# Patient Record
Sex: Male | Born: 1970 | Race: Asian | Hispanic: No | Marital: Single | Smoking: Current every day smoker
Health system: Southern US, Community
[De-identification: ages and names within clinical notes are randomized; demographics above are authoritative.]

## PROBLEM LIST (undated history)

## (undated) DIAGNOSIS — Z9109 Other allergy status, other than to drugs and biological substances: Secondary | ICD-10-CM

## (undated) DIAGNOSIS — J302 Other seasonal allergic rhinitis: Secondary | ICD-10-CM

## (undated) DIAGNOSIS — Z8719 Personal history of other diseases of the digestive system: Secondary | ICD-10-CM

## (undated) DIAGNOSIS — Z8711 Personal history of peptic ulcer disease: Secondary | ICD-10-CM

## (undated) HISTORY — DX: Other seasonal allergic rhinitis: J30.2

## (undated) HISTORY — DX: Personal history of peptic ulcer disease: Z87.11

## (undated) HISTORY — DX: Personal history of other diseases of the digestive system: Z87.19

## (undated) HISTORY — DX: Other allergy status, other than to drugs and biological substances: Z91.09

## (undated) HISTORY — PX: NO PAST SURGERIES: SHX2092

---

## 2012-07-05 ENCOUNTER — Emergency Department: Payer: Self-pay | Admitting: Emergency Medicine

## 2012-07-08 ENCOUNTER — Emergency Department: Payer: Self-pay | Admitting: Internal Medicine

## 2012-07-12 ENCOUNTER — Emergency Department: Payer: Self-pay | Admitting: Unknown Physician Specialty

## 2012-07-19 ENCOUNTER — Emergency Department: Payer: Self-pay | Admitting: Emergency Medicine

## 2015-01-01 ENCOUNTER — Encounter: Payer: Self-pay | Admitting: Physician Assistant

## 2015-01-01 ENCOUNTER — Ambulatory Visit (INDEPENDENT_AMBULATORY_CARE_PROVIDER_SITE_OTHER): Payer: BLUE CROSS/BLUE SHIELD | Admitting: Physician Assistant

## 2015-01-01 VITALS — BP 128/84 | HR 81 | Temp 97.9°F | Resp 16 | Ht 64.0 in | Wt 161.2 lb

## 2015-01-01 DIAGNOSIS — Z720 Tobacco use: Secondary | ICD-10-CM

## 2015-01-01 DIAGNOSIS — Z0000 Encounter for general adult medical examination without abnormal findings: Secondary | ICD-10-CM | POA: Diagnosis not present

## 2015-01-01 DIAGNOSIS — Z91048 Other nonmedicinal substance allergy status: Secondary | ICD-10-CM | POA: Diagnosis not present

## 2015-01-01 DIAGNOSIS — Z9109 Other allergy status, other than to drugs and biological substances: Secondary | ICD-10-CM | POA: Insufficient documentation

## 2015-01-01 DIAGNOSIS — F17200 Nicotine dependence, unspecified, uncomplicated: Secondary | ICD-10-CM | POA: Insufficient documentation

## 2015-01-01 NOTE — Patient Instructions (Signed)
Please stop by the front desk to schedule a lab appointment. Please come fasting to that appointment. I will call you with your results.  Preventive Care for Adults A healthy lifestyle and preventive care can promote health and wellness. Preventive health guidelines for men include the following key practices:  A routine yearly physical is a good way to check with your health care provider about your health and preventative screening. It is a chance to share any concerns and updates on your health and to receive a thorough exam.  Visit your dentist for a routine exam and preventative care every 6 months. Brush your teeth twice a day and floss once a day. Good oral hygiene prevents tooth decay and gum disease.  The frequency of eye exams is based on your age, health, family medical history, use of contact lenses, and other factors. Follow your health care provider's recommendations for frequency of eye exams.  Eat a healthy diet. Foods such as vegetables, fruits, whole grains, low-fat dairy products, and lean protein foods contain the nutrients you need without too many calories. Decrease your intake of foods high in solid fats, added sugars, and salt. Eat the right amount of calories for you.Get information about a proper diet from your health care provider, if necessary.  Regular physical exercise is one of the most important things you can do for your health. Most adults should get at least 150 minutes of moderate-intensity exercise (any activity that increases your heart rate and causes you to sweat) each week. In addition, most adults need muscle-strengthening exercises on 2 or more days a week.  Maintain a healthy weight. The body mass index (BMI) is a screening tool to identify possible weight problems. It provides an estimate of body fat based on height and weight. Your health care provider can find your BMI and can help you achieve or maintain a healthy weight.For adults 20 years and  older:  A BMI below 18.5 is considered underweight.  A BMI of 18.5 to 24.9 is normal.  A BMI of 25 to 29.9 is considered overweight.  A BMI of 30 and above is considered obese.  Maintain normal blood lipids and cholesterol levels by exercising and minimizing your intake of saturated fat. Eat a balanced diet with plenty of fruit and vegetables. Blood tests for lipids and cholesterol should begin at age 65 and be repeated every 5 years. If your lipid or cholesterol levels are high, you are over 50, or you are at high risk for heart disease, you may need your cholesterol levels checked more frequently.Ongoing high lipid and cholesterol levels should be treated with medicines if diet and exercise are not working.  If you smoke, find out from your health care provider how to quit. If you do not use tobacco, do not start.  Lung cancer screening is recommended for adults aged 57-80 years who are at high risk for developing lung cancer because of a history of smoking. A yearly low-dose CT scan of the lungs is recommended for people who have at least a 30-pack-year history of smoking and are a current smoker or have quit within the past 15 years. A pack year of smoking is smoking an average of 1 pack of cigarettes a day for 1 year (for example: 1 pack a day for 30 years or 2 packs a day for 15 years). Yearly screening should continue until the smoker has stopped smoking for at least 15 years. Yearly screening should be stopped for people who  develop a health problem that would prevent them from having lung cancer treatment.  If you choose to drink alcohol, do not have more than 2 drinks per day. One drink is considered to be 12 ounces (355 mL) of beer, 5 ounces (148 mL) of wine, or 1.5 ounces (44 mL) of liquor.  Avoid use of street drugs. Do not share needles with anyone. Ask for help if you need support or instructions about stopping the use of drugs.  High blood pressure causes heart disease and  increases the risk of stroke. Your blood pressure should be checked at least every 1-2 years. Ongoing high blood pressure should be treated with medicines, if weight loss and exercise are not effective.  If you are 36-51 years old, ask your health care provider if you should take aspirin to prevent heart disease.  Diabetes screening involves taking a blood sample to check your fasting blood sugar level. This should be done once every 3 years, after age 71, if you are within normal weight and without risk factors for diabetes. Testing should be considered at a younger age or be carried out more frequently if you are overweight and have at least 1 risk factor for diabetes.  Colorectal cancer can be detected and often prevented. Most routine colorectal cancer screening begins at the age of 14 and continues through age 15. However, your health care provider may recommend screening at an earlier age if you have risk factors for colon cancer. On a yearly basis, your health care provider may provide home test kits to check for hidden blood in the stool. Use of a small camera at the end of a tube to directly examine the colon (sigmoidoscopy or colonoscopy) can detect the earliest forms of colorectal cancer. Talk to your health care provider about this at age 46, when routine screening begins. Direct exam of the colon should be repeated every 5-10 years through age 34, unless early forms of precancerous polyps or small growths are found.  People who are at an increased risk for hepatitis B should be screened for this virus. You are considered at high risk for hepatitis B if:  You were born in a country where hepatitis B occurs often. Talk with your health care provider about which countries are considered high risk.  Your parents were born in a high-risk country and you have not received a shot to protect against hepatitis B (hepatitis B vaccine).  You have HIV or AIDS.  You use needles to inject street  drugs.  You live with, or have sex with, someone who has hepatitis B.  You are a man who has sex with other men (MSM).  You get hemodialysis treatment.  You take certain medicines for conditions such as cancer, organ transplantation, and autoimmune conditions.  Hepatitis C blood testing is recommended for all people born from [birth year] through 1965 and any individual with known risks for hepatitis C.  Practice safe sex. Use condoms and avoid high-risk sexual practices to reduce the spread of sexually transmitted infections (STIs). STIs include gonorrhea, chlamydia, syphilis, trichomonas, herpes, HPV, and human immunodeficiency virus (HIV). Herpes, HIV, and HPV are viral illnesses that have no cure. They can result in disability, cancer, and death.  If you are at risk of being infected with HIV, it is recommended that you take a prescription medicine daily to prevent HIV infection. This is called preexposure prophylaxis (PrEP). You are considered at risk if:  You are a man who has sex with  other men (MSM) and have other risk factors.  You are a heterosexual man, are sexually active, and are at increased risk for HIV infection.  You take drugs by injection.  You are sexually active with a partner who has HIV.  Talk with your health care provider about whether you are at high risk of being infected with HIV. If you choose to begin PrEP, you should first be tested for HIV. You should then be tested every 3 months for as long as you are taking PrEP.  A one-time screening for abdominal aortic aneurysm (AAA) and surgical repair of large AAAs by ultrasound are recommended for men ages 10 to 10 years who are current or former smokers.  Healthy men should no longer receive prostate-specific antigen (PSA) blood tests as part of routine cancer screening. Talk with your health care provider about prostate cancer screening.  Testicular cancer screening is not recommended for adult males who have no  symptoms. Screening includes self-exam, a health care provider exam, and other screening tests. Consult with your health care provider about any symptoms you have or any concerns you have about testicular cancer.  Use sunscreen. Apply sunscreen liberally and repeatedly throughout the day. You should seek shade when your shadow is shorter than you. Protect yourself by wearing long sleeves, pants, a wide-brimmed hat, and sunglasses year round, whenever you are outdoors.  Once a month, do a whole-body skin exam, using a mirror to look at the skin on your back. Tell your health care provider about new moles, moles that have irregular borders, moles that are larger than a pencil eraser, or moles that have changed in shape or color.  Stay current with required vaccines (immunizations).  Influenza vaccine. All adults should be immunized every year.  Tetanus, diphtheria, and acellular pertussis (Td, Tdap) vaccine. An adult who has not previously received Tdap or who does not know his vaccine status should receive 1 dose of Tdap. This initial dose should be followed by tetanus and diphtheria toxoids (Td) booster doses every 10 years. Adults with an unknown or incomplete history of completing a 3-dose immunization series with Td-containing vaccines should begin or complete a primary immunization series including a Tdap dose. Adults should receive a Td booster every 10 years.  Varicella vaccine. An adult without evidence of immunity to varicella should receive 2 doses or a second dose if he has previously received 1 dose.  Human papillomavirus (HPV) vaccine. Males aged 40-21 years who have not received the vaccine previously should receive the 3-dose series. Males aged 22-26 years may be immunized. Immunization is recommended through the age of 51 years for any male who has sex with males and did not get any or all doses earlier. Immunization is recommended for any person with an immunocompromised condition  through the age of 35 years if he did not get any or all doses earlier. During the 3-dose series, the second dose should be obtained 4-8 weeks after the first dose. The third dose should be obtained 24 weeks after the first dose and 16 weeks after the second dose.  Zoster vaccine. One dose is recommended for adults aged 47 years or older unless certain conditions are present.  Measles, mumps, and rubella (MMR) vaccine. Adults born before [birth year] generally are considered immune to measles and mumps. Adults born in [birth year] or later should have 1 or more doses of MMR vaccine unless there is a contraindication to the vaccine or there is laboratory evidence of immunity to each  of the three diseases. A routine second dose of MMR vaccine should be obtained at least 28 days after the first dose for students attending postsecondary schools, health care workers, or international travelers. People who received inactivated measles vaccine or an unknown type of measles vaccine during 1963-1967 should receive 2 doses of MMR vaccine. People who received inactivated mumps vaccine or an unknown type of mumps vaccine before 1979 and are at high risk for mumps infection should consider immunization with 2 doses of MMR vaccine. Unvaccinated health care workers born before [birth year] who lack laboratory evidence of measles, mumps, or rubella immunity or laboratory confirmation of disease should consider measles and mumps immunization with 2 doses of MMR vaccine or rubella immunization with 1 dose of MMR vaccine.  Pneumococcal 13-valent conjugate (PCV13) vaccine. When indicated, a person who is uncertain of his immunization history and has no record of immunization should receive the PCV13 vaccine. An adult aged 34 years or older who has certain medical conditions and has not been previously immunized should receive 1 dose of PCV13 vaccine. This PCV13 should be followed with a dose of pneumococcal polysaccharide (PPSV23) vaccine. The PPSV23  vaccine dose should be obtained at least 8 weeks after the dose of PCV13 vaccine. An adult aged 73 years or older who has certain medical conditions and previously received 1 or more doses of PPSV23 vaccine should receive 1 dose of PCV13. The PCV13 vaccine dose should be obtained 1 or more years after the last PPSV23 vaccine dose.  Pneumococcal polysaccharide (PPSV23) vaccine. When PCV13 is also indicated, PCV13 should be obtained first. All adults aged 42 years and older should be immunized. An adult younger than age 64 years who has certain medical conditions should be immunized. Any person who resides in a nursing home or long-term care facility should be immunized. An adult smoker should be immunized. People with an immunocompromised condition and certain other conditions should receive both PCV13 and PPSV23 vaccines. People with human immunodeficiency virus (HIV) infection should be immunized as soon as possible after diagnosis. Immunization during chemotherapy or radiation therapy should be avoided. Routine use of PPSV23 vaccine is not recommended for American Indians, [place] Natives, or people younger than 65 years unless there are medical conditions that require PPSV23 vaccine. When indicated, people who have unknown immunization and have no record of immunization should receive PPSV23 vaccine. One-time revaccination 5 years after the first dose of PPSV23 is recommended for people aged 19-64 years who have chronic kidney failure, nephrotic syndrome, asplenia, or immunocompromised conditions. People who received 1-2 doses of PPSV23 before age 44 years should receive another dose of PPSV23 vaccine at age 52 years or later if at least 5 years have passed since the previous dose. Doses of PPSV23 are not needed for people immunized with PPSV23 at or after age 47 years.  Meningococcal vaccine. Adults with asplenia or persistent complement component deficiencies should receive 2 doses of quadrivalent  meningococcal conjugate (MenACWY-D) vaccine. The doses should be obtained at least 2 months apart. Microbiologists working with certain meningococcal bacteria, [place] recruits, people at risk during an outbreak, and people who travel to or live in countries with a high rate of meningitis should be immunized. A first-year college student up through age 14 years who is living in a residence hall should receive a dose if he did not receive a dose on or after his 16th birthday. Adults who have certain high-risk conditions should receive one or more doses of vaccine.  Hepatitis A  vaccine. Adults who wish to be protected from this disease, have certain high-risk conditions, work with hepatitis A-infected animals, work in hepatitis A research labs, or travel to or work in countries with a high rate of hepatitis A should be immunized. Adults who were previously unvaccinated and who anticipate close contact with an international adoptee during the first 60 days after arrival in the [place] from a country with a high rate of hepatitis A should be immunized.  Hepatitis B vaccine. Adults should be immunized if they wish to be protected from this disease, have certain high-risk conditions, may be exposed to blood or other infectious body fluids, are household contacts or sex partners of hepatitis B positive people, are clients or workers in certain care facilities, or travel to or work in countries with a high rate of hepatitis B.  Haemophilus influenzae type b (Hib) vaccine. A previously unvaccinated person with asplenia or sickle cell disease or having a scheduled splenectomy should receive 1 dose of Hib vaccine. Regardless of previous immunization, a recipient of a hematopoietic stem cell transplant should receive a 3-dose series 6-12 months after his successful transplant. Hib vaccine is not recommended for adults with HIV infection. Preventive Service / Frequency Ages 72 to 71  Blood pressure check.** /  Every 1 to 2 years.  Lipid and cholesterol check.** / Every 5 years beginning at age 34.  Hepatitis C blood test.** / For any individual with known risks for hepatitis C.  Skin self-exam. / Monthly.  Influenza vaccine. / Every year.  Tetanus, diphtheria, and acellular pertussis (Tdap, Td) vaccine.** / Consult your health care provider. 1 dose of Td every 10 years.  Varicella vaccine.** / Consult your health care provider.  HPV vaccine. / 3 doses over 6 months, if 39 or younger.  Measles, mumps, rubella (MMR) vaccine.** / You need at least 1 dose of MMR if you were born in 1957 or later. You may also need a second dose.  Pneumococcal 13-valent conjugate (PCV13) vaccine.** / Consult your health care provider.  Pneumococcal polysaccharide (PPSV23) vaccine.** / 1 to 2 doses if you smoke cigarettes or if you have certain conditions.  Meningococcal vaccine.** / 1 dose if you are age 81 to 22 years and a Market researcher living in a residence hall, or have one of several medical conditions. You may also need additional booster doses.  Hepatitis A vaccine.** / Consult your health care provider.  Hepatitis B vaccine.** / Consult your health care provider.  Haemophilus influenzae type b (Hib) vaccine.** / Consult your health care provider. Ages 100 to 76  Blood pressure check.** / Every 1 to 2 years.  Lipid and cholesterol check.** / Every 5 years beginning at age 64.  Lung cancer screening. / Every year if you are aged 57-80 years and have a 30-pack-year history of smoking and currently smoke or have quit within the past 15 years. Yearly screening is stopped once you have quit smoking for at least 15 years or develop a health problem that would prevent you from having lung cancer treatment.  Fecal occult blood test (FOBT) of stool. / Every year beginning at age 20 and continuing until age 5. You may not have to do this test if you get a colonoscopy every 10 years.  Flexible  sigmoidoscopy** or colonoscopy.** / Every 5 years for a flexible sigmoidoscopy or every 10 years for a colonoscopy beginning at age 16 and continuing until age 66.  Hepatitis C blood test.** / For  all people born from [birth year] through 1965 and any individual with known risks for hepatitis C.  Skin self-exam. / Monthly.  Influenza vaccine. / Every year.  Tetanus, diphtheria, and acellular pertussis (Tdap/Td) vaccine.** / Consult your health care provider. 1 dose of Td every 10 years.  Varicella vaccine.** / Consult your health care provider.  Zoster vaccine.** / 1 dose for adults aged 39 years or older.  Measles, mumps, rubella (MMR) vaccine.** / You need at least 1 dose of MMR if you were born in 1957 or later. You may also need a second dose.  Pneumococcal 13-valent conjugate (PCV13) vaccine.** / Consult your health care provider.  Pneumococcal polysaccharide (PPSV23) vaccine.** / 1 to 2 doses if you smoke cigarettes or if you have certain conditions.  Meningococcal vaccine.** / Consult your health care provider.  Hepatitis A vaccine.** / Consult your health care provider.  Hepatitis B vaccine.** / Consult your health care provider.  Haemophilus influenzae type b (Hib) vaccine.** / Consult your health care provider. Ages 82 and over  Blood pressure check.** / Every 1 to 2 years.  Lipid and cholesterol check.**/ Every 5 years beginning at age 49.  Lung cancer screening. / Every year if you are aged 2-80 years and have a 30-pack-year history of smoking and currently smoke or have quit within the past 15 years. Yearly screening is stopped once you have quit smoking for at least 15 years or develop a health problem that would prevent you from having lung cancer treatment.  Fecal occult blood test (FOBT) of stool. / Every year beginning at age 66 and continuing until age 73. You may not have to do this test if you get a colonoscopy every 10 years.  Flexible sigmoidoscopy** or  colonoscopy.** / Every 5 years for a flexible sigmoidoscopy or every 10 years for a colonoscopy beginning at age 75 and continuing until age 24.  Hepatitis C blood test.** / For all people born from [birth year] through 1965 and any individual with known risks for hepatitis C.  Abdominal aortic aneurysm (AAA) screening.** / A one-time screening for ages 34 to 52 years who are current or former smokers.  Skin self-exam. / Monthly.  Influenza vaccine. / Every year.  Tetanus, diphtheria, and acellular pertussis (Tdap/Td) vaccine.** / 1 dose of Td every 10 years.  Varicella vaccine.** / Consult your health care provider.  Zoster vaccine.** / 1 dose for adults aged 51 years or older.  Pneumococcal 13-valent conjugate (PCV13) vaccine.** / Consult your health care provider.  Pneumococcal polysaccharide (PPSV23) vaccine.** / 1 dose for all adults aged 26 years and older.  Meningococcal vaccine.** / Consult your health care provider.  Hepatitis A vaccine.** / Consult your health care provider.  Hepatitis B vaccine.** / Consult your health care provider.  Haemophilus influenzae type b (Hib) vaccine.** / Consult your health care provider. **Family history and personal history of risk and conditions may change your health care provider's recommendations. Document Released: 11/11/2001 Document Revised: 09/20/2013 Document Reviewed: 02/10/2011 [facility] Patient Information 2015 [place], [place]. This information is not intended to replace advice given to you by your health care provider. Make sure you discuss any questions you have with your health care provider.

## 2015-01-01 NOTE — Assessment & Plan Note (Signed)
-  Stable. Continue current medication regimen. 

## 2015-01-01 NOTE — Progress Notes (Signed)
Pre visit review using our clinic review tool, if applicable. No additional management support is needed unless otherwise documented below in the visit note/SLS  

## 2015-01-01 NOTE — Progress Notes (Signed)
Patient presents to clinic today to establish care. Is requesting a CPE. Is not fasting for labs.  Acute Concerns: Patient denies acute concerns at today's visit.  Chronic Issues: Environmental allergies  -- takes allergy shots weekly. Uses Allegra when necessary. Endorses good control of symptoms. Denies breakthrough symptoms as long as he is compliant with medications.   Health Maintenance: Dental -- up-to-date  Vision -- up-to-date  Immunizations -- patient endorses up-to-date   Past Medical History  Diagnosis Date  . Seasonal allergies   . Environmental allergies     Past Surgical History  Procedure Laterality Date  . No past surgeries      No current outpatient prescriptions on file prior to visit.   No current facility-administered medications on file prior to visit.    No Known Allergies  Family History  Problem Relation Age of Onset  . Other Father 43    MVA - Deceased  . Healthy Mother   . COPD Maternal Aunt     Deceased  . Healthy Brother     x1  . Healthy Sister     x1  . Healthy Son     x1  . Healthy Daughter     x4    History   Social History  . Marital Status: Single    Spouse Name: N/A  . Number of Children: N/A  . Years of Education: N/A   Occupational History  . Not on file.   Social History Main Topics  . Smoking status: Current Every Day Smoker    Types: Cigarettes  . Smokeless tobacco: Not on file  . Alcohol Use: Not on file  . Drug Use: Not on file  . Sexual Activity: Not on file   Other Topics Concern  . Not on file   Social History Narrative  . No narrative on file    Review of Systems  Constitutional: Negative for fever and weight loss.  HENT: Negative for ear discharge, ear pain, hearing loss and tinnitus.   Eyes: Negative for blurred vision, double vision, photophobia and pain.  Respiratory: Negative for cough and shortness of breath.   Cardiovascular: Negative for chest pain and palpitations.    Gastrointestinal: Negative for heartburn, nausea, vomiting, abdominal pain, diarrhea, constipation, blood in stool and melena.  Genitourinary: Negative for dysuria, urgency, frequency, hematuria and flank pain.  Musculoskeletal: Negative for falls.  Neurological: Negative for dizziness, loss of consciousness and headaches.  Endo/Heme/Allergies: Negative for environmental allergies.  Psychiatric/Behavioral: Negative for depression, suicidal ideas, hallucinations and substance abuse. The patient is not nervous/anxious and does not have insomnia.    BP 128/84 mmHg  Pulse 81  Temp(Src) 97.9 F (36.6 C) (Oral)  Resp 16  Ht 5\' 4"  (1.626 m)  Wt 161 lb 4 oz (73.143 kg)  BMI 27.67 kg/m2  SpO2 99%  Physical Exam  Constitutional: He is oriented to person, place, and time and well-developed, well-nourished, and in no distress.  HENT:  Head: Normocephalic and atraumatic.  Right Ear: External ear normal.  Left Ear: External ear normal.  Nose: Nose normal.  Mouth/Throat: Oropharynx is clear and moist. No oropharyngeal exudate.  Eyes: Conjunctivae and EOM are normal. Pupils are equal, round, and reactive to light.  Neck: Neck supple. No thyromegaly present.  Cardiovascular: Normal rate, regular rhythm, normal heart sounds and intact distal pulses.   Pulmonary/Chest: Effort normal and breath sounds normal. No respiratory distress. He has no wheezes. He has no rales. He exhibits no tenderness.  Abdominal: Soft. Bowel sounds are normal. He exhibits no distension and no mass. There is no tenderness. There is no rebound and no guarding.  Genitourinary: Testes/scrotum normal.  Patient defers DRE.  Lymphadenopathy:    He has no cervical adenopathy.  Neurological: He is alert and oriented to person, place, and time.  Skin: Skin is warm and dry. No rash noted.  Psychiatric: Affect normal.  Vitals reviewed.  Assessment/Plan: Visit for preventive health examination I have reviewed the patient's  medical history in detail and updated the computerized patient record. PHQ -2 screen negative. He had tetanus in 2008.Health Maintenance up-to-date per patient. Preventative care discussed with patient. Handout given. Patient is not fasting for labs. Lab orders placed for future draw. Lab appointment scheduled..     Environmental allergies Stable. Continue current medication regimen.   Tobacco use disorder Smokes 2 packs per week. Only smoking or drinking. Is ready to quit, but wants to try on his own. Smoking cessation discussed. Handout given.

## 2015-01-01 NOTE — Assessment & Plan Note (Signed)
Smokes 2 packs per week. Only smoking or drinking. Is ready to quit, but wants to try on his own. Smoking cessation discussed. Handout given.

## 2015-01-01 NOTE — Assessment & Plan Note (Signed)
I have reviewed the patient's medical history in detail and updated the computerized patient record. PHQ -2 screen negative. He had tetanus in 2008.Health Maintenance up-to-date per patient. Preventative care discussed with patient. Handout given. Patient is not fasting for labs. Lab orders placed for future draw. Lab appointment scheduled.Marland Kitchen

## 2015-01-02 ENCOUNTER — Telehealth: Payer: Self-pay | Admitting: Physician Assistant

## 2015-01-02 NOTE — Telephone Encounter (Signed)
emmi emailed °

## 2015-01-03 ENCOUNTER — Other Ambulatory Visit (INDEPENDENT_AMBULATORY_CARE_PROVIDER_SITE_OTHER): Payer: BLUE CROSS/BLUE SHIELD

## 2015-01-03 DIAGNOSIS — Z0000 Encounter for general adult medical examination without abnormal findings: Secondary | ICD-10-CM | POA: Diagnosis not present

## 2015-01-03 LAB — URINALYSIS, ROUTINE W REFLEX MICROSCOPIC
Bilirubin Urine: NEGATIVE
Hgb urine dipstick: NEGATIVE
Ketones, ur: NEGATIVE
Leukocytes, UA: NEGATIVE
Nitrite: NEGATIVE
RBC / HPF: NONE SEEN (ref 0–?)
Specific Gravity, Urine: >=1.03 — AB (ref 1.000–1.030)
TOTAL PROTEIN, URINE-UPE24: NEGATIVE
URINE GLUCOSE: NEGATIVE
Urobilinogen, UA: 0.2 (ref 0.0–1.0)
WBC UA: NONE SEEN (ref 0–?)
pH: 5.5 (ref 5.0–8.0)

## 2015-01-03 LAB — CBC
HCT: 44.5 % (ref 39.0–52.0)
Hemoglobin: 15.3 g/dL (ref 13.0–17.0)
MCHC: 34.4 g/dL (ref 30.0–36.0)
MCV: 92.5 fl (ref 78.0–100.0)
Platelets: 173 10*3/uL (ref 150.0–400.0)
RBC: 4.81 Mil/uL (ref 4.22–5.81)
RDW: 13.4 % (ref 11.5–15.5)
WBC: 5 10*3/uL (ref 4.0–10.5)

## 2015-01-03 LAB — TSH: TSH: 1.18 u[IU]/mL (ref 0.35–4.50)

## 2015-01-03 LAB — LDL CHOLESTEROL, DIRECT: Direct LDL: 135 mg/dL

## 2015-01-03 LAB — COMPREHENSIVE METABOLIC PANEL
ALT: 30 U/L (ref 0–53)
AST: 19 U/L (ref 0–37)
Albumin: 4.1 g/dL (ref 3.5–5.2)
Alkaline Phosphatase: 45 U/L (ref 39–117)
BILIRUBIN TOTAL: 0.5 mg/dL (ref 0.2–1.2)
BUN: 15 mg/dL (ref 6–23)
CALCIUM: 9.5 mg/dL (ref 8.4–10.5)
CO2: 25 mEq/L (ref 19–32)
Chloride: 105 mEq/L (ref 96–112)
Creatinine, Ser: 0.84 mg/dL (ref 0.40–1.50)
GFR: 105.69 mL/min (ref >60.00)
Glucose, Bld: 99 mg/dL (ref 70–99)
Potassium: 3.9 mEq/L (ref 3.5–5.1)
Sodium: 135 mEq/L (ref 135–145)
Total Protein: 7 g/dL (ref 6.0–8.3)

## 2015-01-03 LAB — LIPID PANEL
CHOLESTEROL: 260 mg/dL — AB (ref 0–200)
HDL: 40 mg/dL (ref >39.00)
Total CHOL/HDL Ratio: 7

## 2015-01-03 LAB — HEMOGLOBIN A1C: HEMOGLOBIN A1C: 5.5 % (ref 4.6–6.5)

## 2015-01-05 ENCOUNTER — Telehealth: Payer: Self-pay | Admitting: *Deleted

## 2015-01-05 MED ORDER — FENOFIBRATE 54 MG PO TABS: 54.0000 mg | ORAL_TABLET | Freq: Every day | ORAL | Status: DC

## 2015-01-05 NOTE — Telephone Encounter (Signed)
-----   Message from Wilfrid Lund, [place] sent at 01/05/2015  7:38 AM EDT -----   ----- Message -----    From: Brunetta Jeans, PA-C    Sent: 01/04/2015  11:04 AM      To: Wilfrid Lund, CMA  Labs good overall.  LDL cholesterol slightly elevated but not worrisome.  His TGL levels are moderately elevated in the 400s.  This increases risk of injury to the pancrea which can result in infection and diabetes.  He needs to avoid all consumption of alcohol and limit refined sugars.  I would like him to start a medication -- fenofibrate -- daily to help lower this levels.  Will hopefully only need this medication for a short period of time.  If he is willing, ok to send in Rx for fenofibrate 54 mg -- take 1 tablet by mouth daily. Quantity 30. 1 refill. Diagnosis -- elevated triglycerides.  Want him to follow-up in 2 months.

## 2015-01-05 NOTE — Telephone Encounter (Signed)
Patient informed, understood & agreed; new Rx to pharmacy/[facility]  

## 2015-03-20 ENCOUNTER — Telehealth: Payer: Self-pay | Admitting: Physician Assistant

## 2015-03-20 NOTE — Telephone Encounter (Signed)
Patient came to clinic today requesting shots for bug bites because going [place] seas to [place] on July 7th; would like to discuss getting appt for that. [phone]

## 2015-03-20 NOTE — Telephone Encounter (Signed)
Reviewed his records -- no immunizations on file.  Recommend he schedule an appointment in office with me to discuss trip, length of stay and areas he will be traveling as this will affect vaccinations needed. Hep A and Hep B as well as typhoid vaccinations are recommended.  Will also likely need antimalarial medication started.  Again, he needs office visit.

## 2016-01-07 ENCOUNTER — Ambulatory Visit (INDEPENDENT_AMBULATORY_CARE_PROVIDER_SITE_OTHER): Payer: BLUE CROSS/BLUE SHIELD | Admitting: Physician Assistant

## 2016-01-07 ENCOUNTER — Encounter: Payer: Self-pay | Admitting: Physician Assistant

## 2016-01-07 VITALS — BP 106/68 | HR 87 | Temp 97.7°F | Ht 64.0 in | Wt 165.8 lb

## 2016-01-07 DIAGNOSIS — K219 Gastro-esophageal reflux disease without esophagitis: Secondary | ICD-10-CM | POA: Diagnosis not present

## 2016-01-07 DIAGNOSIS — Z0000 Encounter for general adult medical examination without abnormal findings: Secondary | ICD-10-CM

## 2016-01-07 DIAGNOSIS — Z6828 Body mass index (BMI) 28.0-28.9, adult: Secondary | ICD-10-CM

## 2016-01-07 DIAGNOSIS — F17200 Nicotine dependence, unspecified, uncomplicated: Secondary | ICD-10-CM | POA: Diagnosis not present

## 2016-01-07 DIAGNOSIS — R7989 Other specified abnormal findings of blood chemistry: Secondary | ICD-10-CM | POA: Diagnosis not present

## 2016-01-07 LAB — URINALYSIS, ROUTINE W REFLEX MICROSCOPIC
Bilirubin Urine: NEGATIVE
Hgb urine dipstick: NEGATIVE
KETONES UR: NEGATIVE
Leukocytes, UA: NEGATIVE
Nitrite: NEGATIVE
RBC / HPF: NONE SEEN (ref 0–?)
SPECIFIC GRAVITY, URINE: 1.025 (ref 1.000–1.030)
Total Protein, Urine: NEGATIVE
Urine Glucose: NEGATIVE
Urobilinogen, UA: 0.2 (ref 0.0–1.0)
WBC UA: NONE SEEN (ref 0–?)
pH: 5.5 (ref 5.0–8.0)

## 2016-01-07 LAB — COMPREHENSIVE METABOLIC PANEL
ALT: 41 U/L (ref 0–53)
AST: 22 U/L (ref 0–37)
Albumin: 4.5 g/dL (ref 3.5–5.2)
Alkaline Phosphatase: 48 U/L (ref 39–117)
BILIRUBIN TOTAL: 0.5 mg/dL (ref 0.2–1.2)
BUN: 14 mg/dL (ref 6–23)
CO2: 28 meq/L (ref 19–32)
CREATININE: 0.87 mg/dL (ref 0.40–1.50)
Calcium: 10.6 mg/dL — ABNORMAL HIGH (ref 8.4–10.5)
Chloride: 106 mEq/L (ref 96–112)
GFR: 101.03 mL/min (ref >60.00)
GLUCOSE: 105 mg/dL — AB (ref 70–99)
Potassium: 4.4 mEq/L (ref 3.5–5.1)
SODIUM: 139 meq/L (ref 135–145)
Total Protein: 7.5 g/dL (ref 6.0–8.3)

## 2016-01-07 LAB — LDL CHOLESTEROL, DIRECT: Direct LDL: 117 mg/dL

## 2016-01-07 LAB — CBC
HCT: 44.6 % (ref 39.0–52.0)
Hemoglobin: 15.4 g/dL (ref 13.0–17.0)
MCHC: 34.5 g/dL (ref 30.0–36.0)
MCV: 92.7 fl (ref 78.0–100.0)
Platelets: 193 10*3/uL (ref 150.0–400.0)
RBC: 4.81 Mil/uL (ref 4.22–5.81)
RDW: 12.9 % (ref 11.5–15.5)
WBC: 5.2 10*3/uL (ref 4.0–10.5)

## 2016-01-07 LAB — LIPID PANEL
CHOLESTEROL: 252 mg/dL — AB (ref 0–200)
HDL: 37.1 mg/dL — ABNORMAL LOW (ref >39.00)
Total CHOL/HDL Ratio: 7

## 2016-01-07 LAB — TSH: TSH: 1.03 u[IU]/mL (ref 0.35–4.50)

## 2016-01-07 LAB — H. PYLORI ANTIBODY, IGG: H PYLORI IGG: NEGATIVE

## 2016-01-07 LAB — HEMOGLOBIN A1C: Hgb A1c MFr Bld: 5.7 % (ref 4.6–6.5)

## 2016-01-07 MED ORDER — PANTOPRAZOLE SODIUM 40 MG PO TBEC: 40.0000 mg | DELAYED_RELEASE_TABLET | Freq: Every day | ORAL | Status: DC

## 2016-01-07 NOTE — Assessment & Plan Note (Signed)
Will begin Protonix and GERD diet. Will check H. Pylori IgG today.

## 2016-01-07 NOTE — Progress Notes (Signed)
Patient presents to clinic today for annual exam.  Patient is fasting for labs.  Acute Concerns: Patient endorses history of peptic ulcer 20 years ago. Endorses having reflux and heart burn with some epigastric pain noted sometimes with heavy meals and with alcohol consumption. Endorses drinking a couple of drinks 1-2 x week. Is also currently a smoker, smoking 0-10 cigarettes per day. Denies nausea, vomiting or bowel changes. Will sometimes take OTC antacids with some relief of symptoms.   Chronic Issues: Environmental Allergies -- Endorses well controlled with Allegra and Flonase.   Tobacco Use Disorder -- Endorses smoking anywhere from 0-10 cigarettes per day, smoking less during the week and more on weekends.   Health Maintenance: Immunizations -- Flu shot and Tetanus shots are up-to-date.  Past Medical History  Diagnosis Date  . Seasonal allergies   . Environmental allergies   . History of stomach ulcers     Past Surgical History  Procedure Laterality Date  . No past surgeries      Current Outpatient Prescriptions on File Prior to Visit  Medication Sig Dispense Refill  . cetirizine (ZYRTEC) 10 MG tablet Take 10 mg by mouth daily as needed for allergies.    . fexofenadine (ALLEGRA) 180 MG tablet Take 180 mg by mouth daily as needed for allergies or rhinitis. Reported on 01/07/2016     No current facility-administered medications on file prior to visit.    No Known Allergies  Family History  Problem Relation Age of Onset  . Other Father 43    MVA - Deceased  . Healthy Mother   . COPD Maternal Aunt     Deceased  . Healthy Brother     x1  . Healthy Sister     x1  . Healthy Son     x1  . Healthy Daughter     x4    Social History   Social History  . Marital Status: Single    Spouse Name: N/A  . Number of Children: N/A  . Years of Education: N/A   Occupational History  . Not on file.   Social History Main Topics  . Smoking status: Current Every Day  Smoker    Types: Cigarettes  . Smokeless tobacco: Never Used     Comment: 2 packs per week  . Alcohol Use: 0.0 oz/week    0 Standard drinks or equivalent per week  . Drug Use: No  . Sexual Activity:    Partners: Female   Other Topics Concern  . Not on file   Social History Narrative   Review of Systems  Constitutional: Negative for fever and weight loss.  HENT: Negative for ear discharge, ear pain, hearing loss and tinnitus.   Eyes: Negative for blurred vision, double vision, photophobia and pain.  Respiratory: Negative for cough and shortness of breath.   Cardiovascular: Negative for chest pain and palpitations.  Gastrointestinal: Positive for heartburn. Negative for nausea, vomiting, abdominal pain, diarrhea, constipation, blood in stool and melena.  Genitourinary: Negative for dysuria, urgency, frequency, hematuria and flank pain.  Musculoskeletal: Negative for falls.  Neurological: Negative for dizziness, loss of consciousness and headaches.  Endo/Heme/Allergies: Negative for environmental allergies.  Psychiatric/Behavioral: Negative for depression, suicidal ideas, hallucinations and substance abuse. The patient is not nervous/anxious and does not have insomnia.     BP 106/68 mmHg  Pulse 87  Temp(Src) 97.7 F (36.5 C) (Oral)  Ht 5\' 4"  (1.626 m)  Wt 165 lb 12.8 oz (75.206 kg)  BMI  28.45 kg/m2  SpO2 99%  Physical Exam  Constitutional: He is oriented to person, place, and time and well-developed, well-nourished, and in no distress.  HENT:  Head: Normocephalic and atraumatic.  Right Ear: External ear normal.  Left Ear: External ear normal.  Nose: Nose normal.  Mouth/Throat: Oropharynx is clear and moist. No oropharyngeal exudate.  Eyes: Conjunctivae and EOM are normal. Pupils are equal, round, and reactive to light.  Neck: Neck supple. No thyromegaly present.  Cardiovascular: Normal rate, regular rhythm, normal heart sounds and intact distal pulses.   Pulmonary/Chest:  Effort normal and breath sounds normal. No respiratory distress. He has no wheezes. He has no rales. He exhibits no tenderness.  Abdominal: Soft. Bowel sounds are normal. He exhibits no distension and no mass. There is no tenderness. There is no rebound and no guarding.  Genitourinary: Testes/scrotum normal.  Lymphadenopathy:    He has no cervical adenopathy.  Neurological: He is alert and oriented to person, place, and time.  Skin: Skin is warm and dry. No rash noted.  Psychiatric: Affect normal.  Vitals reviewed.   No results found for this or any previous visit (from the past 2160 hour(s)).  Assessment/Plan: Tobacco use disorder Discussed risks of smoking and need for cessation. Is likely affecting his GERD. Declines smoking cessation discussion. Is not ready to quit.   Visit for preventive health examination Depression screen negative. Health Maintenance reviewed -- immunizations up-to-date. Preventive schedule discussed and handout given in AVS. Will obtain fasting labs today.   Gastroesophageal reflux disease without esophagitis Will begin Protonix and GERD diet. Will check H. Pylori IgG today.  Body mass index 28.0-28.9, adult Discussed healthy weight, diet and exercise regimen. Will follow.

## 2016-01-07 NOTE — Assessment & Plan Note (Signed)
Discussed healthy weight, diet and exercise regimen. Will follow.

## 2016-01-07 NOTE — Assessment & Plan Note (Signed)
Depression screen negative. Health Maintenance reviewed -- immunizations up-to-date. Preventive schedule discussed and handout given in AVS. Will obtain fasting labs today.  

## 2016-01-07 NOTE — Patient Instructions (Signed)
Please go to the lab for blood work. I will call with your results. Limit alcohol consumption. Please reconsider quitting smoking. It has multiple health hazards, including all the ones we discussed at your visit.  Eat a well-balanced diet and stay active to promote a healthy weight.   Take the Protonix daily as directed for reflux. Follow the diet below. Follow-up in 3-4 weeks.  Food Choices for Gastroesophageal Reflux Disease, Adult When you have gastroesophageal reflux disease (GERD), the foods you eat and your eating habits are very important. Choosing the right foods can help ease the discomfort of GERD. WHAT GENERAL GUIDELINES DO I NEED TO FOLLOW?  Choose fruits, vegetables, whole grains, low-fat dairy products, and low-fat meat, fish, and poultry.  Limit fats such as oils, salad dressings, butter, nuts, and avocado.  Keep a food diary to identify foods that cause symptoms.  Avoid foods that cause reflux. These may be different for different people.  Eat frequent small meals instead of three large meals each day.  Eat your meals slowly, in a relaxed setting.  Limit fried foods.  Cook foods using methods other than frying.  Avoid drinking alcohol.  Avoid drinking large amounts of liquids with your meals.  Avoid bending over or lying down until 2-3 hours after eating. WHAT FOODS ARE NOT RECOMMENDED? The following are some foods and drinks that may worsen your symptoms: Vegetables Tomatoes. Tomato juice. Tomato and spaghetti sauce. Chili peppers. Onion and garlic. Horseradish. Fruits Oranges, grapefruit, and lemon (fruit and juice). Meats High-fat meats, fish, and poultry. This includes hot dogs, ribs, ham, sausage, salami, and bacon. Dairy Whole milk and chocolate milk. Sour cream. Cream. Butter. Ice cream. Cream cheese.  Beverages Coffee and tea, with or without caffeine. Carbonated beverages or energy drinks. Condiments Hot sauce. Barbecue sauce.   Sweets/Desserts Chocolate and cocoa. Donuts. Peppermint and spearmint. Fats and Oils High-fat foods, including [place] fries and potato chips. Other Vinegar. Strong spices, such as black pepper, white pepper, red pepper, cayenne, curry powder, cloves, ginger, and chili powder. The items listed above may not be a complete list of foods and beverages to avoid. Contact your dietitian for more information.   This information is not intended to replace advice given to you by your health care provider. Make sure you discuss any questions you have with your health care provider.   Document Released: 09/15/2005 Document Revised: 10/06/2014 Document Reviewed: 07/20/2013 Elsevier Interactive Patient Education 2016 [place] for Adults, Male A healthy lifestyle and preventive care can promote health and wellness. Preventive health guidelines for men include the following key practices:  A routine yearly physical is a good way to check with your health care provider about your health and preventative screening. It is a chance to share any concerns and updates on your health and to receive a thorough exam.  Visit your dentist for a routine exam and preventative care every 6 months. Brush your teeth twice a day and floss once a day. Good oral hygiene prevents tooth decay and gum disease.  The frequency of eye exams is based on your age, health, family medical history, use of contact lenses, and other factors. Follow your health care provider's recommendations for frequency of eye exams.  Eat a healthy diet. Foods such as vegetables, fruits, whole grains, low-fat dairy products, and lean protein foods contain the nutrients you need without too many calories. Decrease your intake of foods high in solid fats, added sugars, and salt. Eat the right  amount of calories for you.Get information about a proper diet from your health care provider, if necessary.  Regular physical exercise is  one of the most important things you can do for your health. Most adults should get at least 150 minutes of moderate-intensity exercise (any activity that increases your heart rate and causes you to sweat) each week. In addition, most adults need muscle-strengthening exercises on 2 or more days a week.  Maintain a healthy weight. The body mass index (BMI) is a screening tool to identify possible weight problems. It provides an estimate of body fat based on height and weight. Your health care provider can find your BMI and can help you achieve or maintain a healthy weight.For adults 20 years and older:  A BMI below 18.5 is considered underweight.  A BMI of 18.5 to 24.9 is normal.  A BMI of 25 to 29.9 is considered overweight.  A BMI of 30 and above is considered obese.  Maintain normal blood lipids and cholesterol levels by exercising and minimizing your intake of saturated fat. Eat a balanced diet with plenty of fruit and vegetables. Blood tests for lipids and cholesterol should begin at age 99 and be repeated every 5 years. If your lipid or cholesterol levels are high, you are over 50, or you are at high risk for heart disease, you may need your cholesterol levels checked more frequently.Ongoing high lipid and cholesterol levels should be treated with medicines if diet and exercise are not working.  If you smoke, find out from your health care provider how to quit. If you do not use tobacco, do not start.  Lung cancer screening is recommended for adults aged 54-80 years who are at high risk for developing lung cancer because of a history of smoking. A yearly low-dose CT scan of the lungs is recommended for people who have at least a 30-pack-year history of smoking and are a current smoker or have quit within the past 15 years. A pack year of smoking is smoking an average of 1 pack of cigarettes a day for 1 year (for example: 1 pack a day for 30 years or 2 packs a day for 15 years). Yearly  screening should continue until the smoker has stopped smoking for at least 15 years. Yearly screening should be stopped for people who develop a health problem that would prevent them from having lung cancer treatment.  If you choose to drink alcohol, do not have more than 2 drinks per day. One drink is considered to be 12 ounces (355 mL) of beer, 5 ounces (148 mL) of wine, or 1.5 ounces (44 mL) of liquor.  Avoid use of street drugs. Do not share needles with anyone. Ask for help if you need support or instructions about stopping the use of drugs.  High blood pressure causes heart disease and increases the risk of stroke. Your blood pressure should be checked at least every 1-2 years. Ongoing high blood pressure should be treated with medicines, if weight loss and exercise are not effective.  If you are 3-64 years old, ask your health care provider if you should take aspirin to prevent heart disease.  Diabetes screening is done by taking a blood sample to check your blood glucose level after you have not eaten for a certain period of time (fasting). If you are not overweight and you do not have risk factors for diabetes, you should be screened once every 3 years starting at age 55. If you are overweight  or obese and you are 71-68 years of age, you should be screened for diabetes every year as part of your cardiovascular risk assessment.  Colorectal cancer can be detected and often prevented. Most routine colorectal cancer screening begins at the age of 18 and continues through age 51. However, your health care provider may recommend screening at an earlier age if you have risk factors for colon cancer. On a yearly basis, your health care provider may provide home test kits to check for hidden blood in the stool. Use of a small camera at the end of a tube to directly examine the colon (sigmoidoscopy or colonoscopy) can detect the earliest forms of colorectal cancer. Talk to your health care provider  about this at age 9, when routine screening begins. Direct exam of the colon should be repeated every 5-10 years through age 18, unless early forms of precancerous polyps or small growths are found.  People who are at an increased risk for hepatitis B should be screened for this virus. You are considered at high risk for hepatitis B if:  You were born in a country where hepatitis B occurs often. Talk with your health care provider about which countries are considered high risk.  Your parents were born in a high-risk country and you have not received a shot to protect against hepatitis B (hepatitis B vaccine).  You have HIV or AIDS.  You use needles to inject street drugs.  You live with, or have sex with, someone who has hepatitis B.  You are a man who has sex with other men (MSM).  You get hemodialysis treatment.  You take certain medicines for conditions such as cancer, organ transplantation, and autoimmune conditions.  Hepatitis C blood testing is recommended for all people born from [birth year] through 1965 and any individual with known risks for hepatitis C.  Practice safe sex. Use condoms and avoid high-risk sexual practices to reduce the spread of sexually transmitted infections (STIs). STIs include gonorrhea, chlamydia, syphilis, trichomonas, herpes, HPV, and human immunodeficiency virus (HIV). Herpes, HIV, and HPV are viral illnesses that have no cure. They can result in disability, cancer, and death.  If you are a man who has sex with other men, you should be screened at least once per year for:  HIV.  Urethral, rectal, and pharyngeal infection of gonorrhea, chlamydia, or both.  If you are at risk of being infected with HIV, it is recommended that you take a prescription medicine daily to prevent HIV infection. This is called preexposure prophylaxis (PrEP). You are considered at risk if:  You are a man who has sex with other men (MSM) and have other risk factors.  You are a  heterosexual man, are sexually active, and are at increased risk for HIV infection.  You take drugs by injection.  You are sexually active with a partner who has HIV.  Talk with your health care provider about whether you are at high risk of being infected with HIV. If you choose to begin PrEP, you should first be tested for HIV. You should then be tested every 3 months for as long as you are taking PrEP.  A one-time screening for abdominal aortic aneurysm (AAA) and surgical repair of large AAAs by ultrasound are recommended for men ages 56 to 59 years who are current or former smokers.  Healthy men should no longer receive prostate-specific antigen (PSA) blood tests as part of routine cancer screening. Talk with your health care provider about prostate cancer  screening.  Testicular cancer screening is not recommended for adult males who have no symptoms. Screening includes self-exam, a health care provider exam, and other screening tests. Consult with your health care provider about any symptoms you have or any concerns you have about testicular cancer.  Use sunscreen. Apply sunscreen liberally and repeatedly throughout the day. You should seek shade when your shadow is shorter than you. Protect yourself by wearing long sleeves, pants, a wide-brimmed hat, and sunglasses year round, whenever you are outdoors.  Once a month, do a whole-body skin exam, using a mirror to look at the skin on your back. Tell your health care provider about new moles, moles that have irregular borders, moles that are larger than a pencil eraser, or moles that have changed in shape or color.  Stay current with required vaccines (immunizations).  Influenza vaccine. All adults should be immunized every year.  Tetanus, diphtheria, and acellular pertussis (Td, Tdap) vaccine. An adult who has not previously received Tdap or who does not know his vaccine status should receive 1 dose of Tdap. This initial dose should be  followed by tetanus and diphtheria toxoids (Td) booster doses every 10 years. Adults with an unknown or incomplete history of completing a 3-dose immunization series with Td-containing vaccines should begin or complete a primary immunization series including a Tdap dose. Adults should receive a Td booster every 10 years.  Varicella vaccine. An adult without evidence of immunity to varicella should receive 2 doses or a second dose if he has previously received 1 dose.  Human papillomavirus (HPV) vaccine. Males aged 11-21 years who have not received the vaccine previously should receive the 3-dose series. Males aged 22-26 years may be immunized. Immunization is recommended through the age of 60 years for any male who has sex with males and did not get any or all doses earlier. Immunization is recommended for any person with an immunocompromised condition through the age of 59 years if he did not get any or all doses earlier. During the 3-dose series, the second dose should be obtained 4-8 weeks after the first dose. The third dose should be obtained 24 weeks after the first dose and 16 weeks after the second dose.  Zoster vaccine. One dose is recommended for adults aged 65 years or older unless certain conditions are present.  Measles, mumps, and rubella (MMR) vaccine. Adults born before [birth year] generally are considered immune to measles and mumps. Adults born in [birth year] or later should have 1 or more doses of MMR vaccine unless there is a contraindication to the vaccine or there is laboratory evidence of immunity to each of the three diseases. A routine second dose of MMR vaccine should be obtained at least 28 days after the first dose for students attending postsecondary schools, health care workers, or international travelers. People who received inactivated measles vaccine or an unknown type of measles vaccine during 1963-1967 should receive 2 doses of MMR vaccine. People who received inactivated mumps vaccine  or an unknown type of mumps vaccine before 1979 and are at high risk for mumps infection should consider immunization with 2 doses of MMR vaccine. Unvaccinated health care workers born before [birth year] who lack laboratory evidence of measles, mumps, or rubella immunity or laboratory confirmation of disease should consider measles and mumps immunization with 2 doses of MMR vaccine or rubella immunization with 1 dose of MMR vaccine.  Pneumococcal 13-valent conjugate (PCV13) vaccine. When indicated, a person who is uncertain of his immunization history and has  no record of immunization should receive the PCV13 vaccine. All adults 80 years of age and older should receive this vaccine. An adult aged 61 years or older who has certain medical conditions and has not been previously immunized should receive 1 dose of PCV13 vaccine. This PCV13 should be followed with a dose of pneumococcal polysaccharide (PPSV23) vaccine. Adults who are at high risk for pneumococcal disease should obtain the PPSV23 vaccine at least 8 weeks after the dose of PCV13 vaccine. Adults older than 45 years of age who have normal immune system function should obtain the PPSV23 vaccine dose at least 1 year after the dose of PCV13 vaccine.  Pneumococcal polysaccharide (PPSV23) vaccine. When PCV13 is also indicated, PCV13 should be obtained first. All adults aged 86 years and older should be immunized. An adult younger than age 75 years who has certain medical conditions should be immunized. Any person who resides in a nursing home or long-term care facility should be immunized. An adult smoker should be immunized. People with an immunocompromised condition and certain other conditions should receive both PCV13 and PPSV23 vaccines. People with human immunodeficiency virus (HIV) infection should be immunized as soon as possible after diagnosis. Immunization during chemotherapy or radiation therapy should be avoided. Routine use of PPSV23 vaccine is not  recommended for American Indians, [place] Natives, or people younger than 65 years unless there are medical conditions that require PPSV23 vaccine. When indicated, people who have unknown immunization and have no record of immunization should receive PPSV23 vaccine. One-time revaccination 5 years after the first dose of PPSV23 is recommended for people aged 19-64 years who have chronic kidney failure, nephrotic syndrome, asplenia, or immunocompromised conditions. People who received 1-2 doses of PPSV23 before age 109 years should receive another dose of PPSV23 vaccine at age 76 years or later if at least 5 years have passed since the previous dose. Doses of PPSV23 are not needed for people immunized with PPSV23 at or after age 37 years.  Meningococcal vaccine. Adults with asplenia or persistent complement component deficiencies should receive 2 doses of quadrivalent meningococcal conjugate (MenACWY-D) vaccine. The doses should be obtained at least 2 months apart. Microbiologists working with certain meningococcal bacteria, [place] recruits, people at risk during an outbreak, and people who travel to or live in countries with a high rate of meningitis should be immunized. A first-year college student up through age 32 years who is living in a residence hall should receive a dose if he did not receive a dose on or after his 16th birthday. Adults who have certain high-risk conditions should receive one or more doses of vaccine.  Hepatitis A vaccine. Adults who wish to be protected from this disease, have chronic liver disease, work with hepatitis A-infected animals, work in hepatitis A research labs, or travel to or work in countries with a high rate of hepatitis A should be immunized. Adults who were previously unvaccinated and who anticipate close contact with an international adoptee during the first 60 days after arrival in the [place] from a country with a high rate of hepatitis A should be  immunized.  Hepatitis B vaccine. Adults should be immunized if they wish to be protected from this disease, are under age 45 years and have diabetes, have chronic liver disease, have had more than one sex partner in the past 6 months, may be exposed to blood or other infectious body fluids, are household contacts or sex partners of hepatitis B positive people, are clients or workers  in certain care facilities, or travel to or work in countries with a high rate of hepatitis B.  Haemophilus influenzae type b (Hib) vaccine. A previously unvaccinated person with asplenia or sickle cell disease or having a scheduled splenectomy should receive 1 dose of Hib vaccine. Regardless of previous immunization, a recipient of a hematopoietic stem cell transplant should receive a 3-dose series 6-12 months after his successful transplant. Hib vaccine is not recommended for adults with HIV infection. Preventive Service / Frequency Ages 36 to 67  Blood pressure check.** / Every 3-5 years.  Lipid and cholesterol check.** / Every 5 years beginning at age 65.  Hepatitis C blood test.** / For any individual with known risks for hepatitis C.  Skin self-exam. / Monthly.  Influenza vaccine. / Every year.  Tetanus, diphtheria, and acellular pertussis (Tdap, Td) vaccine.** / Consult your health care provider. 1 dose of Td every 10 years.  Varicella vaccine.** / Consult your health care provider.  HPV vaccine. / 3 doses over 6 months, if 80 or younger.  Measles, mumps, rubella (MMR) vaccine.** / You need at least 1 dose of MMR if you were born in 1957 or later. You may also need a second dose.  Pneumococcal 13-valent conjugate (PCV13) vaccine.** / Consult your health care provider.  Pneumococcal polysaccharide (PPSV23) vaccine.** / 1 to 2 doses if you smoke cigarettes or if you have certain conditions.  Meningococcal vaccine.** / 1 dose if you are age 61 to 67 years and a [employer] researcher living in a  residence hall, or have one of several medical conditions. You may also need additional booster doses.  Hepatitis A vaccine.** / Consult your health care provider.  Hepatitis B vaccine.** / Consult your health care provider.  Haemophilus influenzae type b (Hib) vaccine.** / Consult your health care provider. Ages 43 to 29  Blood pressure check.** / Every year.  Lipid and cholesterol check.** / Every 5 years beginning at age 15.  Lung cancer screening. / Every year if you are aged 52-80 years and have a 30-pack-year history of smoking and currently smoke or have quit within the past 15 years. Yearly screening is stopped once you have quit smoking for at least 15 years or develop a health problem that would prevent you from having lung cancer treatment.  Fecal occult blood test (FOBT) of stool. / Every year beginning at age 13 and continuing until age 57. You may not have to do this test if you get a colonoscopy every 10 years.  Flexible sigmoidoscopy** or colonoscopy.** / Every 5 years for a flexible sigmoidoscopy or every 10 years for a colonoscopy beginning at age 23 and continuing until age 2.  Hepatitis C blood test.** / For all people born from [birth year] through 1965 and any individual with known risks for hepatitis C.  Skin self-exam. / Monthly.  Influenza vaccine. / Every year.  Tetanus, diphtheria, and acellular pertussis (Tdap/Td) vaccine.** / Consult your health care provider. 1 dose of Td every 10 years.  Varicella vaccine.** / Consult your health care provider.  Zoster vaccine.** / 1 dose for adults aged 73 years or older.  Measles, mumps, rubella (MMR) vaccine.** / You need at least 1 dose of MMR if you were born in 1957 or later. You may also need a second dose.  Pneumococcal 13-valent conjugate (PCV13) vaccine.** / Consult your health care provider.  Pneumococcal polysaccharide (PPSV23) vaccine.** / 1 to 2 doses if you smoke cigarettes or if you have certain  conditions.  Meningococcal vaccine.** / Consult your health care provider.  Hepatitis A vaccine.** / Consult your health care provider.  Hepatitis B vaccine.** / Consult your health care provider.  Haemophilus influenzae type b (Hib) vaccine.** / Consult your health care provider. Ages 56 and over  Blood pressure check.** / Every year.  Lipid and cholesterol check.**/ Every 5 years beginning at age 63.  Lung cancer screening. / Every year if you are aged 9-80 years and have a 30-pack-year history of smoking and currently smoke or have quit within the past 15 years. Yearly screening is stopped once you have quit smoking for at least 15 years or develop a health problem that would prevent you from having lung cancer treatment.  Fecal occult blood test (FOBT) of stool. / Every year beginning at age 34 and continuing until age 67. You may not have to do this test if you get a colonoscopy every 10 years.  Flexible sigmoidoscopy** or colonoscopy.** / Every 5 years for a flexible sigmoidoscopy or every 10 years for a colonoscopy beginning at age 8 and continuing until age 70.  Hepatitis C blood test.** / For all people born from [birth year] through 1965 and any individual with known risks for hepatitis C.  Abdominal aortic aneurysm (AAA) screening.** / A one-time screening for ages 86 to 88 years who are current or former smokers.  Skin self-exam. / Monthly.  Influenza vaccine. / Every year.  Tetanus, diphtheria, and acellular pertussis (Tdap/Td) vaccine.** / 1 dose of Td every 10 years.  Varicella vaccine.** / Consult your health care provider.  Zoster vaccine.** / 1 dose for adults aged 70 years or older.  Pneumococcal 13-valent conjugate (PCV13) vaccine.** / 1 dose for all adults aged 39 years and older.  Pneumococcal polysaccharide (PPSV23) vaccine.** / 1 dose for all adults aged 19 years and older.  Meningococcal vaccine.** / Consult your health care provider.  Hepatitis A  vaccine.** / Consult your health care provider.  Hepatitis B vaccine.** / Consult your health care provider.  Haemophilus influenzae type b (Hib) vaccine.** / Consult your health care provider. **Family history and personal history of risk and conditions may change your health care provider's recommendations.   This information is not intended to replace advice given to you by your health care provider. Make sure you discuss any questions you have with your health care provider.   Document Released: 11/11/2001 Document Revised: 10/06/2014 Document Reviewed: 02/10/2011 Elsevier Interactive Patient Education [employer].

## 2016-01-07 NOTE — Assessment & Plan Note (Signed)
Discussed risks of smoking and need for cessation. Is likely affecting his GERD. Declines smoking cessation discussion. Is not ready to quit.

## 2016-01-07 NOTE — Progress Notes (Signed)
Pre visit review using our clinic review tool, if applicable. No additional management support is needed unless otherwise documented below in the visit note. 

## 2016-01-08 ENCOUNTER — Telehealth: Payer: Self-pay | Admitting: *Deleted

## 2016-01-08 MED ORDER — FENOFIBRATE 54 MG PO TABS: 54.0000 mg | ORAL_TABLET | Freq: Every day | ORAL | Status: DC

## 2016-01-08 NOTE — Telephone Encounter (Signed)
Called and spoke with the pt and informed him of recent lab results and note.  Pt verbalized understanding and agreed.  Pt already has an appt scheduled for (02/04/16 @ 9:15am).  Rx sent to the pharmacy by e-script.//AB/CMA

## 2016-01-08 NOTE — Telephone Encounter (Signed)
-----   Message from Brunetta Jeans, PA-C sent at 01/07/2016  9:05 PM EDT ----- Labs great overall. LDL improved but TGL still poor. Please refill his fenofibrate and have him restart daily. Follow-up 4 weeks with me for reassessment and repeat TGL levels. HE needs to stop alcohol consumption, increase exercise and start a fish oil supplement if not already doing so.

## 2016-02-04 ENCOUNTER — Encounter: Payer: Self-pay | Admitting: Physician Assistant

## 2016-02-04 ENCOUNTER — Ambulatory Visit (INDEPENDENT_AMBULATORY_CARE_PROVIDER_SITE_OTHER): Payer: BLUE CROSS/BLUE SHIELD | Admitting: Physician Assistant

## 2016-02-04 VITALS — BP 120/72 | HR 79 | Temp 97.9°F | Ht 64.0 in | Wt 168.0 lb

## 2016-02-04 DIAGNOSIS — G5622 Lesion of ulnar nerve, left upper limb: Secondary | ICD-10-CM

## 2016-02-04 DIAGNOSIS — E782 Mixed hyperlipidemia: Secondary | ICD-10-CM | POA: Diagnosis not present

## 2016-02-04 DIAGNOSIS — G5620 Lesion of ulnar nerve, unspecified upper limb: Secondary | ICD-10-CM | POA: Insufficient documentation

## 2016-02-04 LAB — LIPID PANEL
Cholesterol: 254 mg/dL — ABNORMAL HIGH (ref 0–200)
HDL: 39.7 mg/dL (ref >39.00)
Total CHOL/HDL Ratio: 6
Triglycerides: 500 mg/dL — ABNORMAL HIGH (ref 0.0–149.0)

## 2016-02-04 LAB — LDL CHOLESTEROL, DIRECT: Direct LDL: 139 mg/dL

## 2016-02-04 NOTE — Assessment & Plan Note (Signed)
Referral to specialty placed for further assessment and management giving decreased sensation of left ulnar distribution.

## 2016-02-04 NOTE — Assessment & Plan Note (Signed)
Is taking medication as directed. Will continue fenofibrate. Limit alcohol. Will repeat labs today.

## 2016-02-04 NOTE — Progress Notes (Signed)
Pre visit review using our clinic review tool, if applicable. No additional management support is needed unless otherwise documented below in the visit note. 

## 2016-02-04 NOTE — Patient Instructions (Signed)
Please go to the lab for repeat blood work. Continue the fenofibrate and avoidance of alcohol consumption. I will call you with your results. We will alter your regimen based on this.  For the numbness in the hand -- This seems consistent with cubital tunnel syndrome. You will be contacted by specialist for further assessment giving chronic symptoms.

## 2016-02-04 NOTE — Progress Notes (Signed)
Patient presents to clinic today for follow-up of elevated triglycerides and reassessment of cholesterol. Patient was started on fenofibrate 54 mg daily. Endorses taking daily as directed. Patient was instructed to cut back on alcohol consumption due to TGL. Endorses only drinking 1-2 x week since last visit. Has been watching diet.    Patient c/o numbness in 4th and 5th phalanges of left hand over the past 6-7 months. Endorses sometimes having sharp pain radiating from elbow. Denies trauma or injury. Denies swelling.   Past Medical History  Diagnosis Date  . Seasonal allergies   . Environmental allergies   . History of stomach ulcers     Current Outpatient Prescriptions on File Prior to Visit  Medication Sig Dispense Refill  . cetirizine (ZYRTEC) 10 MG tablet Take 10 mg by mouth daily as needed for allergies.    . fenofibrate 54 MG tablet Take 1 tablet (54 mg total) by mouth daily. 30 tablet 1  . fluticasone (FLONASE) 50 MCG/ACT nasal spray Place 1 spray into both nostrils 2 (two) times daily as needed.  0  . pantoprazole (PROTONIX) 40 MG tablet Take 1 tablet (40 mg total) by mouth daily. 30 tablet 3   No current facility-administered medications on file prior to visit.    No Known Allergies  Family History  Problem Relation Age of Onset  . Other Father 78    MVA - Deceased  . Healthy Mother   . COPD Maternal Aunt     Deceased  . Healthy Brother     x1  . Healthy Sister     x1  . Healthy Son     x1  . Healthy Daughter     x4    Social History   Social History  . Marital Status: Single    Spouse Name: N/A  . Number of Children: N/A  . Years of Education: N/A   Social History Main Topics  . Smoking status: Current Every Day Smoker    Types: Cigarettes  . Smokeless tobacco: Never Used     Comment: 2 packs per week  . Alcohol Use: 0.0 oz/week    0 Standard drinks or equivalent per week  . Drug Use: No  . Sexual Activity:    Partners: Female   Other Topics  Concern  . None   Social History Narrative   Review of Systems - See HPI.  All other ROS are negative.  BP 120/72 mmHg  Pulse 79  Temp(Src) 97.9 F (36.6 C) (Oral)  Ht _0  (1.626 m)  Wt 168 lb (76.204 kg)  BMI 28.82 kg/m2  SpO2 97%  Physical Exam  Constitutional: He is oriented to person, place, and time and well-developed, well-nourished, and in no distress.  HENT:  Head: Normocephalic and atraumatic.  Eyes: Conjunctivae are normal.  Cardiovascular: Normal rate, regular rhythm, normal heart sounds and intact distal pulses.   Pulmonary/Chest: Effort normal and breath sounds normal. No respiratory distress. He has no wheezes. He has no rales. He exhibits no tenderness.  Musculoskeletal:       Left elbow: Normal.       Left wrist: Normal.       Left hand: He exhibits normal range of motion, no tenderness and normal capillary refill. Decreased sensation noted. Decreased sensation is present in the ulnar distribution. Decreased sensation is not present in the medial redistribution and is not present in the radial distribution. Normal strength noted.  Neurological: He is alert and oriented to person, place,  and time.  Skin: Skin is warm and dry. No rash noted.  Psychiatric: Affect normal.  Vitals reviewed.   Recent Results (from the past 2160 hour(s))  CBC     Status: None   Collection Time: 01/07/16 10:24 AM  Result Value Ref Range   WBC 5.2 4.0 - 10.5 K/uL   RBC 4.81 4.22 - 5.81 Mil/uL   Platelets 193.0 150.0 - 400.0 K/uL   Hemoglobin 15.4 13.0 - 17.0 g/dL   HCT 44.6 39.0 - 52.0 %   MCV 92.7 78.0 - 100.0 fl   MCHC 34.5 30.0 - 36.0 g/dL   RDW 12.9 11.5 - 15.5 %  Comp Met (CMET)     Status: Abnormal   Collection Time: 01/07/16 10:24 AM  Result Value Ref Range   Sodium 139 135 - 145 mEq/L   Potassium 4.4 3.5 - 5.1 mEq/L   Chloride 106 96 - 112 mEq/L   CO2 28 19 - 32 mEq/L   Glucose, Bld 105 (H) 70 - 99 mg/dL   BUN 14 6 - 23 mg/dL   Creatinine, Ser 0.87 0.40 - 1.50  mg/dL   Total Bilirubin 0.5 0.2 - 1.2 mg/dL   Alkaline Phosphatase 48 39 - 117 U/L   AST 22 0 - 37 U/L   ALT 41 0 - 53 U/L   Total Protein 7.5 6.0 - 8.3 g/dL   Albumin 4.5 3.5 - 5.2 g/dL   Calcium 10.6 (H) 8.4 - 10.5 mg/dL   GFR 101.03 >60.00 mL/min  TSH     Status: None   Collection Time: 01/07/16 10:24 AM  Result Value Ref Range   TSH 1.03 0.35 - 4.50 uIU/mL  Hemoglobin A1c     Status: None   Collection Time: 01/07/16 10:24 AM  Result Value Ref Range   Hgb A1c MFr Bld 5.7 4.6 - 6.5 %    Comment: Glycemic Control Guidelines for People with Diabetes:Non Diabetic:  <6%Goal of Therapy: <7%Additional Action Suggested:  >8%   Urinalysis, Routine w reflex microscopic     Status: None   Collection Time: 01/07/16 10:24 AM  Result Value Ref Range   Color, Urine YELLOW Yellow;Lt. Yellow   APPearance CLEAR Clear   Specific Gravity, Urine 1.025 1.000-1.030   pH 5.5 5.0 - 8.0   Total Protein, Urine NEGATIVE Negative   Urine Glucose NEGATIVE Negative   Ketones, ur NEGATIVE Negative   Bilirubin Urine NEGATIVE Negative   Hgb urine dipstick NEGATIVE Negative   Urobilinogen, UA 0.2 0.0 - 1.0   Leukocytes, UA NEGATIVE Negative   Nitrite NEGATIVE Negative   WBC, UA none seen 0-2/hpf   RBC / HPF none seen 0-2/hpf  Lipid Profile     Status: Abnormal   Collection Time: 01/07/16 10:24 AM  Result Value Ref Range   Cholesterol 252 (H) 0 - 200 mg/dL    Comment: ATP III Classification       Desirable:  < 200 mg/dL               Borderline High:  200 - 239 mg/dL          High:  > = 240 mg/dL   Triglycerides (H) 0.0 - 149.0 mg/dL    490.0 Triglyceride is over 400; calculations on Lipids are invalid.    Comment: Normal:  <150 mg/dLBorderline High:  150 - 199 mg/dL   HDL 37.10 (L) >39.00 mg/dL   Total CHOL/HDL Ratio 7     Comment:  Men          Women1/2 Average Risk     3.4          3.3Average Risk          5.0          4.42X Average Risk          9.6          7.13X Average Risk           15.0          11.0                      H. pylori antibody, IgG     Status: None   Collection Time: 01/07/16 10:24 AM  Result Value Ref Range   H Pylori IgG Negative Negative  LDL cholesterol, direct     Status: None   Collection Time: 01/07/16 10:24 AM  Result Value Ref Range   Direct LDL 117.0 mg/dL    Comment: Optimal:  <100 mg/dLNear or Above Optimal:  100-129 mg/dLBorderline High:  130-159 mg/dLHigh:  160-189 mg/dLVery High:  >190 mg/dL    Assessment/Plan: Elevated cholesterol with high triglycerides Is taking medication as directed. Will continue fenofibrate. Limit alcohol. Will repeat labs today.  Cubital tunnel syndrome Referral to specialty placed for further assessment and management giving decreased sensation of left ulnar distribution.

## 2016-02-06 ENCOUNTER — Other Ambulatory Visit: Payer: Self-pay | Admitting: *Deleted

## 2016-02-06 NOTE — Telephone Encounter (Signed)
-----   Message from Brunetta Jeans, PA-C sent at 02/05/2016 10:20 AM EDT ----- Triglycerides are still elevated -- no improvement. Would like to increase his fenofibrate to 160 per day.Ok to send in new Rx. He is to take daily as directed. FU in 2 months.

## 2016-02-06 NOTE — Telephone Encounter (Signed)
Attempt to reach pt via phone, no answer, no ans machine; new Rx pending/SLS 05/10

## 2016-02-12 ENCOUNTER — Encounter: Payer: Self-pay | Admitting: *Deleted

## 2016-02-12 DIAGNOSIS — E782 Mixed hyperlipidemia: Secondary | ICD-10-CM

## 2016-02-12 MED ORDER — FENOFIBRATE 160 MG PO TABS: 160.0000 mg | ORAL_TABLET | Freq: Every day | ORAL | Status: DC

## 2016-02-12 NOTE — Telephone Encounter (Signed)
Letter Mailed/SLS 05/15

## 2016-04-21 ENCOUNTER — Ambulatory Visit (INDEPENDENT_AMBULATORY_CARE_PROVIDER_SITE_OTHER): Payer: BLUE CROSS/BLUE SHIELD | Admitting: Neurology

## 2016-04-21 ENCOUNTER — Encounter: Payer: Self-pay | Admitting: Neurology

## 2016-04-21 VITALS — BP 130/80 | HR 98 | Ht 64.0 in | Wt 168.6 lb

## 2016-04-21 DIAGNOSIS — F17200 Nicotine dependence, unspecified, uncomplicated: Secondary | ICD-10-CM | POA: Diagnosis not present

## 2016-04-21 DIAGNOSIS — G5622 Lesion of ulnar nerve, left upper limb: Secondary | ICD-10-CM | POA: Diagnosis not present

## 2016-04-21 NOTE — Progress Notes (Signed)
[facility] Clinic Note - Initial Visit   Date: 04/21/16  Kevin Graham MRN: [id number] DOB: September 11, 1971   Dear [name], PA:  Thank you for your kind referral of Kevin Graham for consultation of cubital tunnel syndrome. Although his history is well known to you, please allow [name] to reiterate it for the purpose of our medical record. The patient was accompanied to the clinic by self.    History of Present Illness: Kevin Graham is a 45 y.o. right-handed [place] male with tobacco use presenting for evaluation of left hand paresthesias.    Starting around 2016, patient began noticing reduced sensation over the 4th and 5th digit on the left which radiates up his forearm towards his elbow and sometimes to the shoulder.  He has noticed 90% benefit with acupuncture which lasts about 6 months, but eventually symptoms return and he has constant paresthesias of the little finger.  The tingling is not very bothersome, moreso annoying.  He denies any hand weakness or neck pain. He endorses sleeping on the left side with his elbow flexed.   He has noticed that symptoms are especially worse when driving as he has a habit of resting his arm on the door.   He recalls playing marbles a lot when he was younger and the looser would often get flicked by the winner at the elbow to cause pain and tingling.  Of note, he has been smoking 5-6 cigarettes daily since the age of 79-7, as this was a norm in his culture growing up.  He also admits to drinking 2-3 beers several times per week and more on the weekends.   Out-side paper records, electronic medical record, and images have been reviewed where available and summarized as:  Lab Results  Component Value Date   TSH 1.03 01/07/2016   Lab Results  Component Value Date   HGBA1C 5.7 01/07/2016     Past Medical History:  Diagnosis Date  . Environmental allergies   . History of stomach ulcers   . Seasonal allergies      Past Surgical History:  Procedure Laterality Date  . NO PAST SURGERIES       Medications:  Outpatient Encounter Prescriptions as of 04/21/2016  Medication Sig Note  . azelastine (OPTIVAR) 0.05 % ophthalmic solution As needed 02/04/2016: Received from: External Pharmacy Received Sig:   . cetirizine (ZYRTEC) 10 MG tablet Take 10 mg by mouth daily as needed for allergies.   . fenofibrate 160 MG tablet Take 1 tablet (160 mg total) by mouth daily.   . fluticasone (FLONASE) 50 MCG/ACT nasal spray Place 1 spray into both nostrils 2 (two) times daily as needed. 01/07/2016: Received from: External Pharmacy Received Sig:   . pantoprazole (PROTONIX) 40 MG tablet Take 1 tablet (40 mg total) by mouth daily.    No facility-administered encounter medications on file as of 04/21/2016.      Allergies: Not on File  Family History: Family History  Problem Relation Age of Onset  . Other Father 34    MVA - Deceased  . Healthy Mother   . COPD Maternal Aunt     Deceased  . Healthy Brother     x1  . Healthy Sister     x1  . Healthy Son     x1  . Healthy Daughter     x4    Social History: Social History  Substance Use Topics  . Smoking status: Current Every Day Smoker    Packs/day:  0.20    Years: 38.00    Types: Cigarettes  . Smokeless tobacco: Never Used     Comment: 2 packs per week  . Alcohol use 1.8 oz/week    3 Cans of beer per week     Comment: 1-3 beers during the week, more on the weekends   Social History   Social History Narrative   Lives with girlfriend and her child in a one bedroom home.  Has 5 children.  Works at [employer].  Education: high school.    Review of Systems:  CONSTITUTIONAL: No fevers, chills, night sweats, or weight loss.   EYES: No visual changes or eye pain ENT: No hearing changes.  No history of nose bleeds.   RESPIRATORY: No cough, wheezing and shortness of breath.   CARDIOVASCULAR: Negative for chest pain, and palpitations.   GI: Negative for  abdominal discomfort, blood in stools or black stools.  No recent change in bowel habits.   GU:  No history of incontinence.   MUSCLOSKELETAL: No history of joint pain or swelling.  No myalgias.   SKIN: Negative for lesions, rash, and itching.   HEMATOLOGY/ONCOLOGY: Negative for prolonged bleeding, bruising easily, and swollen nodes.  No history of cancer.   ENDOCRINE: Negative for cold or heat intolerance, polydipsia or goiter.   PSYCH:  No depression or anxiety symptoms.   NEURO: As Above.   Vital Signs:  BP 130/80   Pulse 98   Ht 5\' 4"  (1.626 m)   Wt 168 lb 9 oz (76.5 kg)   SpO2 98%   BMI 28.93 kg/m    General Medical Exam:   General:  Well appearing, comfortable.   Eyes/ENT: see cranial nerve examination.   Neck: No masses appreciated.  Full range of motion without tenderness.  No carotid bruits. Respiratory:  Clear to auscultation, good air entry bilaterally.   Cardiac:  Regular rate and rhythm, no murmur.   Extremities:  No deformities, edema, or skin discoloration.  Skin:  No rashes or lesions.  Neurological Exam: MENTAL STATUS including orientation to time, place, person, recent and remote memory, attention span and concentration, language, and fund of knowledge is normal.  Speech is not dysarthric.  CRANIAL NERVES: II:  No visual field defects.  Unremarkable fundi.   III-IV-VI: Pupils equal round and reactive to light.  Normal conjugate, extra-ocular eye movements in all directions of gaze.  No nystagmus.  No ptosis.   V:  Normal facial sensation.   VII:  Normal facial symmetry and movements. VIII:  Normal hearing and vestibular function.   IX-X:  Normal palatal movement.   XI:  Normal shoulder shrug and head rotation.   XII:  Normal tongue strength and range of motion, no deviation or fasciculation.  MOTOR:  No atrophy, fasciculations or abnormal movements.  No pronator drift.  Tone is normal.    Right Upper Extremity:    Left Upper Extremity:    Deltoid  5/5    Deltoid  5/5   Biceps  5/5   Biceps  5/5   Triceps  5/5   Triceps  5/5   Wrist extensors  5/5   Wrist extensors  5/5   Wrist flexors  5/5   Wrist flexors  5/5   Finger extensors  5/5   Finger extensors  5/5   Finger flexors  5/5   Finger flexors  5/5   Dorsal interossei  5/5   Dorsal interossei  5/5   Abductor pollicis  5/5  Abductor pollicis  5/5   Tone (Ashworth scale)  0  Tone (Ashworth scale)  0   Right Lower Extremity:    Left Lower Extremity:    Hip flexors  5/5   Hip flexors  5/5   Hip extensors  5/5   Hip extensors  5/5   Knee flexors  5/5   Knee flexors  5/5   Knee extensors  5/5   Knee extensors  5/5   Dorsiflexors  5/5   Dorsiflexors  5/5   Plantarflexors  5/5   Plantarflexors  5/5   Toe extensors  5/5   Toe extensors  5/5   Toe flexors  5/5   Toe flexors  5/5   Tone (Ashworth scale)  0  Tone (Ashworth scale)  0   MSRs:  Brisk and symmetric reflexes throughout 2+/4  SENSORY:  Pin prick and temperature is reduced over the left fifth digit, otherwise normal and symmetric perception of light touch, pinprick, and vibration. Tinel's sign positive over the left medial elbow  COORDINATION/GAIT: Normal finger-to- nose-finger. Intact rapid alternating movements bilaterally.  Able to rise from a chair without using arms.  Gait narrow based and stable.   IMPRESSION: 1.  Left ulnar neuropathy at the elbow (cubital tunnel syndrome)  - NCS/EMG of the left arm will be ordered to determine severity of symptoms and be sure symptoms are not due to C8 radiculopathy  - Start using a soft elbow pad to prevent compression of the nerve at the elbow  - At nighttime, place the foam pad over the inner elbow to prevent over flexing.  Also instructed him to minimize hyperflexion of the elbow during the day such as when using the phone or driving  2.  Tobacco use  - Tobacco cessation encouraged and counseling given  3.  Alcohol use  - Encouraged patient to limit alcohol to 2 drinks in one night  and reduce to 2-3 times per week  Return to clinic in 4 months.   The duration of this appointment visit was 40 minutes of face-to-face time with the patient.  Greater than 50% of this time was spent in counseling, explanation of diagnosis, planning of further management, and coordination of care.   Thank you for allowing me to participate in patient's care.  If I can answer any additional questions, I would be pleased to do so.    Sincerely,    Miyana Mordecai K. Posey Pronto, DO

## 2016-04-21 NOTE — Patient Instructions (Addendum)
1.  Start using a soft elbow pad to prevent compression of the nerve at the elbow 2.  At nighttime, place the foam pad over the inner elbow to prevent over flexing 3.  Electrodiagnostic testing of the left arm 4.  Stop smoking 5.  Limit alcohol intake  Return to clinic in 4 months

## 2016-04-29 ENCOUNTER — Telehealth: Payer: Self-pay | Admitting: Neurology

## 2016-04-29 ENCOUNTER — Ambulatory Visit (INDEPENDENT_AMBULATORY_CARE_PROVIDER_SITE_OTHER): Payer: BLUE CROSS/BLUE SHIELD | Admitting: Neurology

## 2016-04-29 DIAGNOSIS — G5622 Lesion of ulnar nerve, left upper limb: Secondary | ICD-10-CM

## 2016-04-29 DIAGNOSIS — F17200 Nicotine dependence, unspecified, uncomplicated: Secondary | ICD-10-CM

## 2016-04-29 NOTE — Procedures (Signed)
[facility]  [address]  [place], [place] [address] Tel: [phone] Fax:  [phone] Test Date:  04/29/2016  Patient: Kevin Graham DOB: 1970/12/11 Physician: Narda Amber, DO  Sex: Male Height: 5\' 4"  Ref Phys: Narda Amber, DO  ID#: [id number] Temp: 34.0C Technician: Jerilynn Mages. Dean   Patient Complaints: This is a 45 year old gentleman referred for evaluation of left hand numbness and tingling involving the fourth and fifth fingers.  NCV & EMG Findings: Extensive electrodiagnostic testing of the left upper extremity shows: 1. Left median and ulnar sensory responses are within normal limits. 2. Left median motor responses within normal limits. Left ulnar motor response shows absolute conduction velocity slowing across the elbow (A-Elbow 64, B-Elbow 50 m/s) with normal latency and amplitude. 3. There is no evidence of active or chronic motor axonal loss changes affecting any of the tested muscles. Motor unit configuration and recruitment pattern is within normal limits.  Impression: 1. Left ulnar neuropathy with slowing across the elbow, purely demyelinating in type; very mild in degree electrically. 2. There is no evidence of a cervical radiculopathy affecting the left upper extremity.   ___________________________ Narda Amber, DO    Nerve Conduction Studies Anti Sensory Summary Table   Stim Site NR Peak (ms) Norm Peak (ms) P-T Amp (V) Norm P-T Amp  Left DorsCutan Anti Sensory (Dorsum 5th MC)  Wrist    1.8 <3.1 32.9 >12  Left Median Anti Sensory (2nd Digit)  Wrist    3.4 <3.4 43.3 >20  Left Ulnar Anti Sensory (5th Digit)  Wrist    2.9 <3.1 46.3 >12   Motor Summary Table   Stim Site NR Onset (ms) Norm Onset (ms) O-P Amp (mV) Norm O-P Amp Site1 Site2 Delta-0 (ms) Dist (cm) Vel (m/s) Norm Vel (m/s)  Left Median Motor (Abd Poll Brev)  Wrist    3.5 <3.9 7.4 >6 Elbow Wrist 3.7 22.0 59 >50  Elbow    7.2  7.1         Left Ulnar Motor (Abd Dig Minimi)  33.6C   Wrist    2.4 <3.1 11.3 >7 B Elbow Wrist 2.5 16.0 64 >50  B Elbow    4.9  11.2  A Elbow B Elbow 2.0 10.0 50 >50  A Elbow    6.9  10.8          EMG   Side Muscle Ins Act Fibs Psw Fasc Number Recrt Dur Dur. Amp Amp. Poly Poly. Comment  Left 1stDorInt Nml Nml Nml Nml Nml Nml Nml Nml Nml Nml Nml Nml N/A  Left Ext Indicis Nml Nml Nml Nml Nml Nml Nml Nml Nml Nml Nml Nml N/A  Left PronatorTeres Nml Nml Nml Nml Nml Nml Nml Nml Nml Nml Nml Nml N/A  Left Biceps Nml Nml Nml Nml Nml Nml Nml Nml Nml Nml Nml Nml N/A  Left Triceps Nml Nml Nml Nml Nml Nml Nml Nml Nml Nml Nml Nml N/A  Left ABD Dig Min Nml Nml Nml Nml Nml Nml Nml Nml Nml Nml Nml Nml N/A  Left FlexDigProf 4,5 Nml Nml Nml Nml Nml Nml Nml Nml Nml Nml Nml Nml N/A      Waveforms:

## 2016-04-29 NOTE — Telephone Encounter (Signed)
EMG results discussed with patient which shows a mild ulnar neuropathy across the elbow. Conservative therapies again discussed.    Kevin K. Posey Pronto, DO

## 2016-05-26 ENCOUNTER — Telehealth: Payer: Self-pay | Admitting: Physician Assistant

## 2016-05-26 NOTE — Telephone Encounter (Signed)
30 day supply of pantoprazole sent to pharmacy. Pt was due for 2 month follow up with Einar Pheasant in July and is past due. Please call pt to schedule appt soon.  Thanks!

## 2016-05-30 NOTE — Telephone Encounter (Signed)
Appointment scheduled with patient for 06/27/2016 @ 9:15

## 2016-06-27 ENCOUNTER — Encounter: Payer: Self-pay | Admitting: Physician Assistant

## 2016-06-27 ENCOUNTER — Ambulatory Visit (INDEPENDENT_AMBULATORY_CARE_PROVIDER_SITE_OTHER): Payer: BLUE CROSS/BLUE SHIELD | Admitting: Physician Assistant

## 2016-06-27 VITALS — BP 0/0 | HR 75 | Temp 97.8°F | Resp 16 | Ht 64.0 in | Wt 162.2 lb

## 2016-06-27 DIAGNOSIS — Z23 Encounter for immunization: Secondary | ICD-10-CM

## 2016-06-27 DIAGNOSIS — F1010 Alcohol abuse, uncomplicated: Secondary | ICD-10-CM

## 2016-06-27 DIAGNOSIS — Z789 Other specified health status: Secondary | ICD-10-CM

## 2016-06-27 DIAGNOSIS — E781 Pure hyperglyceridemia: Secondary | ICD-10-CM | POA: Diagnosis not present

## 2016-06-27 LAB — LIPID PANEL
Cholesterol: 270 mg/dL — ABNORMAL HIGH (ref 0–200)
HDL: 43.8 mg/dL (ref >39.00)
Total CHOL/HDL Ratio: 6
Triglycerides: 435 mg/dL — ABNORMAL HIGH (ref 0.0–149.0)

## 2016-06-27 LAB — LDL CHOLESTEROL, DIRECT: LDL DIRECT: 173 mg/dL

## 2016-06-27 NOTE — Progress Notes (Signed)
Patient presents to clinic today for follow-up of elevated triglyceride levels. At last check, patients fenofibrate was increased to 160 mg daily. Patient endorses taking medication daily as directed. Is not exercising regularly and endorses poor diet overall. Patient with history of alcohol overuse, currently endorsing about 24+ drinks per week - mix of beer and liquor.    Past Medical History:  Diagnosis Date  . Environmental allergies   . History of stomach ulcers   . Seasonal allergies     Current Outpatient Prescriptions on File Prior to Visit  Medication Sig Dispense Refill  . azelastine (OPTIVAR) 0.05 % ophthalmic solution As needed  0  . cetirizine (ZYRTEC) 10 MG tablet Take 10 mg by mouth daily as needed for allergies.    . fenofibrate 160 MG tablet Take 1 tablet (160 mg total) by mouth daily. 30 tablet 3  . fluticasone (FLONASE) 50 MCG/ACT nasal spray Place 1 spray into both nostrils 2 (two) times daily as needed.  0  . pantoprazole (PROTONIX) 40 MG tablet take 1 tablet by mouth once daily 30 tablet 0   No current facility-administered medications on file prior to visit.     No Known Allergies  Family History  Problem Relation Age of Onset  . Other Father 39    MVA - Deceased  . Healthy Mother   . COPD Maternal Aunt     Deceased  . Healthy Brother     x1  . Healthy Sister     x1  . Healthy Son     x1  . Healthy Daughter     x4    Social History   Social History  . Marital status: Single    Spouse name: N/A  . Number of children: N/A  . Years of education: N/A   Social History Main Topics  . Smoking status: Current Every Day Smoker    Packs/day: 0.20    Years: 38.00    Types: Cigarettes  . Smokeless tobacco: Never Used     Comment: 2 packs per week  . Alcohol use 1.8 oz/week    3 Cans of beer per week     Comment: 1-3 beers during the week, more on the weekends  . Drug use: No  . Sexual activity: Yes    Partners: Female   Other Topics Concern   . None   Social History Narrative   Lives with girlfriend and her child in a one bedroom home.  Has 5 children.  Works at [employer].  Education: high school.    Review of Systems - See HPI.  All other ROS are negative.  BP (!) 0/0 (BP Location: Left Arm, Patient Position: Sitting, Cuff Size: Normal)   Pulse 75   Temp 97.8 F (36.6 C) (Oral)   Resp 16   Ht 5\' 4"  (1.626 m)   Wt 162 lb 4 oz (73.6 kg)   SpO2 99%   BMI 27.85 kg/m   Physical Exam  Constitutional: He is well-developed, well-nourished, and in no distress.  HENT:  Head: Normocephalic and atraumatic.  Eyes: Conjunctivae are normal.  Cardiovascular: Normal rate, regular rhythm, normal heart sounds and intact distal pulses.   Pulmonary/Chest: Effort normal.  Skin: Skin is warm and dry. No rash noted.  Psychiatric: Affect normal.  Vitals reviewed.  Assessment/Plan: 1. Hypertriglyceridemia Will repeat lipid panel today. Discussed appropriate diet and exercise. Alcohol use a major contributor. Discussed limitation of alcohol. - Lipid Profile  2. Alcohol consumption heavy 24+  drinks per week. CAGE questionnaire performed with a score of 1 -- Eye Opener. Patient does not feel his drinking is abnormal. Does not feel there needs to be a change. Discussed risk of pancreatitis 2/2 elevated triglycerides. Discussed long-term effects of excess alcohol consumption. Encouraged patient to cut down on intake to at least half of current weekly consumption. Patient states he will work on this.    [name], PA-C

## 2016-06-27 NOTE — Progress Notes (Signed)
Pre visit review using our clinic review tool, if applicable. No additional management support is needed unless otherwise documented below in the visit note/SLS  

## 2016-06-27 NOTE — Patient Instructions (Signed)
Please go to the lab for blood work. I will call you with your results.  Please continue medications as directed. Try to really cut back on your alcohol consumption. You are drinking too much and it is having a negative effect on Triglycerides and most likely will have a negative effect on your liver.  Follow-up will be based on results.

## 2016-06-30 ENCOUNTER — Other Ambulatory Visit: Payer: Self-pay | Admitting: *Deleted

## 2016-06-30 NOTE — Telephone Encounter (Signed)
Attempt to reach pt via phone, "voice mailbox has not yet been set up"; new Rx pending/SLS 10/02

## 2016-06-30 NOTE — Telephone Encounter (Signed)
-----   Message from Brunetta Jeans, PA-C sent at 06/27/2016  9:31 PM EDT ----- TGL are still elevated as suspected giving drinking habits that we have reviewed in detail. LDL is now increased to 173 (steadily increased over past few checks). Want him to start on Lipitor 20 mg daily in addition to the fenofibrate. He has got to stop drinking and increase exercise. Ok to send in 30 days supply of Lipitor with 1 refill. FU in 2 months for reassessment of levels.

## 2016-07-01 DIAGNOSIS — Z23 Encounter for immunization: Secondary | ICD-10-CM | POA: Diagnosis not present

## 2016-07-11 NOTE — Telephone Encounter (Signed)
Called the patient no answer/no  Voice mail set up yet.

## 2016-07-14 NOTE — Telephone Encounter (Signed)
Called the patient - no answer / no voicemail.

## 2016-07-16 ENCOUNTER — Other Ambulatory Visit: Payer: Self-pay | Admitting: Physician Assistant

## 2016-07-16 ENCOUNTER — Encounter: Payer: Self-pay | Admitting: *Deleted

## 2016-07-16 DIAGNOSIS — E782 Mixed hyperlipidemia: Secondary | ICD-10-CM

## 2016-07-16 NOTE — Telephone Encounter (Signed)
I have been unable to reach this patient by phone.  A letter is being sent/SLS 10/18

## 2016-07-17 NOTE — Telephone Encounter (Signed)
Medication filled to pharmacy as requested.   

## 2016-08-10 ENCOUNTER — Other Ambulatory Visit: Payer: Self-pay | Admitting: Physician Assistant

## 2016-08-25 ENCOUNTER — Ambulatory Visit: Payer: BLUE CROSS/BLUE SHIELD | Admitting: Neurology

## 2016-10-02 ENCOUNTER — Encounter: Payer: Self-pay | Admitting: Internal Medicine

## 2016-10-02 ENCOUNTER — Ambulatory Visit (INDEPENDENT_AMBULATORY_CARE_PROVIDER_SITE_OTHER): Payer: BLUE CROSS/BLUE SHIELD | Admitting: Internal Medicine

## 2016-10-02 VITALS — BP 126/78 | HR 82 | Temp 98.2°F | Ht 62.5 in | Wt 166.0 lb

## 2016-10-02 DIAGNOSIS — K219 Gastro-esophageal reflux disease without esophagitis: Secondary | ICD-10-CM

## 2016-10-02 DIAGNOSIS — E782 Mixed hyperlipidemia: Secondary | ICD-10-CM

## 2016-10-02 DIAGNOSIS — E7800 Pure hypercholesterolemia, unspecified: Secondary | ICD-10-CM

## 2016-10-02 DIAGNOSIS — Z9109 Other allergy status, other than to drugs and biological substances: Secondary | ICD-10-CM | POA: Diagnosis not present

## 2016-10-02 LAB — COMPREHENSIVE METABOLIC PANEL
ALK PHOS: 42 U/L (ref 39–117)
ALT: 28 U/L (ref 0–53)
AST: 18 U/L (ref 0–37)
Albumin: 4.6 g/dL (ref 3.5–5.2)
BUN: 13 mg/dL (ref 6–23)
CHLORIDE: 106 meq/L (ref 96–112)
CO2: 31 mEq/L (ref 19–32)
Calcium: 10.3 mg/dL (ref 8.4–10.5)
Creatinine, Ser: 1.03 mg/dL (ref 0.40–1.50)
GFR: 82.87 mL/min (ref >60.00)
Glucose, Bld: 102 mg/dL — ABNORMAL HIGH (ref 70–99)
POTASSIUM: 4.5 meq/L (ref 3.5–5.1)
SODIUM: 140 meq/L (ref 135–145)
TOTAL PROTEIN: 7.3 g/dL (ref 6.0–8.3)
Total Bilirubin: 0.3 mg/dL (ref 0.2–1.2)

## 2016-10-02 LAB — LIPID PANEL
CHOL/HDL RATIO: 5
Cholesterol: 235 mg/dL — ABNORMAL HIGH (ref 0–200)
HDL: 46.1 mg/dL (ref >39.00)
LDL CALC: 149 mg/dL — AB (ref 0–99)
NONHDL: 188.63
Triglycerides: 198 mg/dL — ABNORMAL HIGH (ref 0.0–149.0)
VLDL: 39.6 mg/dL (ref 0.0–40.0)

## 2016-10-02 NOTE — Assessment & Plan Note (Signed)
Continue Zyrtec, Flonase and Azelastine eye drops as needed

## 2016-10-02 NOTE — Assessment & Plan Note (Signed)
LDL high and he is not on a statin, not sure why CMET and Lipid profile today Encouraged him to consume a low fat diet Continue Fenofibrate for now

## 2016-10-02 NOTE — Patient Instructions (Signed)
Fat and Cholesterol Restricted Diet Introduction Getting too much fat and cholesterol in your diet may cause health problems. Following this diet helps keep your fat and cholesterol at normal levels. This can keep you from getting sick. What types of fat should I choose?  Choose monosaturated and polyunsaturated fats. These are found in foods such as olive oil, canola oil, flaxseeds, walnuts, almonds, and seeds.  Eat more omega-3 fats. Good choices include salmon, mackerel, sardines, tuna, flaxseed oil, and ground flaxseeds.  Limit saturated fats. These are in animal products such as meats, butter, and cream. They can also be in plant products such as palm oil, palm kernel oil, and coconut oil.  Avoid foods with partially hydrogenated oils in them. These contain trans fats. Examples of foods that have trans fats are stick margarine, some tub margarines, cookies, crackers, and other baked goods. What general guidelines do I need to follow?  Check food labels. Look for the words "trans fat" and "saturated fat."  When preparing a meal:  Fill half of your plate with vegetables and green salads.  Fill one fourth of your plate with whole grains. Look for the word "whole" as the first word in the ingredient list.  Fill one fourth of your plate with lean protein foods.  Eat more foods that have fiber, like apples, carrots, beans, peas, and barley.  Eat more home-cooked foods. Eat less at restaurants and buffets.  Limit or avoid alcohol.  Limit foods high in starch and sugar.  Limit fried foods.  Cook foods without frying them. Baking, boiling, grilling, and broiling are all great options.  Lose weight if you are overweight. Losing even a small amount of weight can help your overall health. It can also help prevent diseases such as diabetes and heart disease. What foods can I eat? Grains  Whole grains, such as whole wheat or whole grain breads, crackers, cereals, and pasta. Unsweetened  oatmeal, bulgur, barley, quinoa, or brown rice. Corn or whole wheat flour tortillas. Vegetables  Fresh or frozen vegetables (raw, steamed, roasted, or grilled). Green salads. Fruits  All fresh, canned (in natural juice), or frozen fruits. Meat and Other Protein Products  Ground beef (85% or leaner), grass-fed beef, or beef trimmed of fat. Skinless chicken or turkey. Ground chicken or turkey. Pork trimmed of fat. All fish and seafood. Eggs. Dried beans, peas, or lentils. Unsalted nuts or seeds. Unsalted canned or dry beans. Dairy  Low-fat dairy products, such as skim or 1% milk, 2% or reduced-fat cheeses, low-fat ricotta or cottage cheese, or plain low-fat yogurt. Fats and Oils  Tub margarines without trans fats. Light or reduced-fat mayonnaise and salad dressings. Avocado. Olive, canola, sesame, or safflower oils. Natural peanut or almond butter (choose ones without added sugar and oil). The items listed above may not be a complete list of recommended foods or beverages. Contact your dietitian for more options.  What foods are not recommended? Grains  White bread. White pasta. White rice. Cornbread. Bagels, pastries, and croissants. Crackers that contain trans fat. Vegetables  White potatoes. Corn. Creamed or fried vegetables. Vegetables in a cheese sauce. Fruits  Dried fruits. Canned fruit in light or heavy syrup. Fruit juice. Meat and Other Protein Products  Fatty cuts of meat. Ribs, chicken wings, bacon, sausage, bologna, salami, chitterlings, fatback, hot dogs, bratwurst, and packaged luncheon meats. Liver and organ meats. Dairy  Whole or 2% milk, cream, half-and-half, and cream cheese. Whole milk cheeses. Whole-fat or sweetened yogurt. Full-fat cheeses. Nondairy creamers and   whipped toppings. Processed cheese, cheese spreads, or cheese curds. Sweets and Desserts  Corn syrup, sugars, honey, and molasses. Candy. Jam and jelly. Syrup. Sweetened cereals. Cookies, pies, cakes, donuts,  muffins, and ice cream. Fats and Oils  Butter, stick margarine, lard, shortening, ghee, or bacon fat. Coconut, palm kernel, or palm oils. Beverages  Alcohol. Sweetened drinks (such as sodas, lemonade, and fruit drinks or punches). The items listed above may not be a complete list of foods and beverages to avoid. Contact your dietitian for more information.  This information is not intended to replace advice given to you by your health care provider. Make sure you discuss any questions you have with your health care provider. Document Released: 03/16/2012 Document Revised: 05/22/2016 Document Reviewed: 12/15/2013  2017 Elsevier  

## 2016-10-02 NOTE — Assessment & Plan Note (Signed)
Controlled on Pepcid Discussed avoiding triggers and how weight loss could improve his reflux.

## 2016-10-02 NOTE — Progress Notes (Signed)
HPI  Pt presents to the clinic today to establish care and for management of the conditions listed below. He is transferring care from [place], [place].  Environmental Allergies: Worse in the fall. He takes Flonase, Zyrtec and Azelastine eye drops as needed with good relief.  History of Stomach Ulcers: Triggered by alcohol. He takes Pepcid once day and denies any breakthrough reflux.  HLD: His last LDL was 173.0, 05/2016. He is taking Fenofibrate as directed. He does try to consume a low fat diet.  Flu: 06/2016 Tetanus: 05/2016 Vision Screening: as needed Dentist: biannually   Past Medical History:  Diagnosis Date  . Environmental allergies   . History of stomach ulcers   . Seasonal allergies     Current Outpatient Prescriptions  Medication Sig Dispense Refill  . azelastine (OPTIVAR) 0.05 % ophthalmic solution As needed  0  . cetirizine (ZYRTEC) 10 MG tablet Take 10 mg by mouth daily as needed for allergies.    . famotidine (PEPCID) 10 MG tablet Take 10 mg by mouth 2 (two) times daily.    . fenofibrate 160 MG tablet take 1 tablet by mouth once daily 30 tablet 2  . fluticasone (FLONASE) 50 MCG/ACT nasal spray Place 1 spray into both nostrils 2 (two) times daily as needed.  0   No current facility-administered medications for this visit.     No Known Allergies  Family History  Problem Relation Age of Onset  . Hyperlipidemia Mother     Social History   Social History  . Marital status: Single    Spouse name: N/A  . Number of children: N/A  . Years of education: N/A   Occupational History  . Not on file.   Social History Main Topics  . Smoking status: Current Every Day Smoker    Packs/day: 0.25    Years: 38.00    Types: Cigarettes  . Smokeless tobacco: Never Used     Comment: 2 packs per week  . Alcohol use 1.8 oz/week    3 Cans of beer per week     Comment: 1-3 beers during the week, more on the weekends  . Drug use: No  . Sexual activity: Yes    Partners:  Female   Other Topics Concern  . Not on file   Social History Narrative   Lives with girlfriend and her child in a one bedroom home.  Has 5 children.  Works at [employer].  Education: high school.    ROS:  Constitutional: Denies fever, malaise, fatigue, headache or abrupt weight changes.  Respiratory: Denies difficulty breathing, shortness of breath, cough or sputum production.   Cardiovascular: Denies chest pain, chest tightness, palpitations or swelling in the hands or feet.  Gastrointestinal: Denies abdominal pain, bloating, constipation, diarrhea or blood in the stool.  Neurological: Denies dizziness, difficulty with memory, difficulty with speech or problems with balance and coordination.  Psych: Denies anxiety, depression, SI/HI.  No other specific complaints in a complete review of systems (except as listed in HPI above).  PE:  BP 126/78   Pulse 82   Temp 98.2 F (36.8 C) (Oral)   Ht 5' 2.5" (1.588 m)   Wt 166 lb (75.3 kg)   SpO2 98%   BMI 29.88 kg/m   Wt Readings from Last 3 Encounters:  10/02/16 166 lb (75.3 kg)  06/27/16 162 lb 4 oz (73.6 kg)  04/21/16 168 lb 9 oz (76.5 kg)    General: Appears his stated age, well developed, well nourished  in NAD. Cardiovascular: Normal rate and rhythm. S1,S2 noted.   Pulmonary/Chest: Normal effort and positive vesicular breath sounds. No respiratory distress. No wheezes, rales or ronchi noted.  Abdomen: Soft and nontender. Normal bowel sounds. Neurological: Alert and oriented.   Psychiatric: Mood and affect normal. Behavior is normal. Judgment and thought content normal.    BMET    Component Value Date/Time   NA 139 01/07/2016 1024   K 4.4 01/07/2016 1024   CL 106 01/07/2016 1024   CO2 28 01/07/2016 1024   GLUCOSE 105 (H) 01/07/2016 1024   BUN 14 01/07/2016 1024   CREATININE 0.87 01/07/2016 1024   CALCIUM 10.6 (H) 01/07/2016 1024    Lipid Panel     Component Value Date/Time   CHOL 270 (H) 06/27/2016 0959   TRIG  (H) 06/27/2016 0959    435.0 Triglyceride is over 400; calculations on Lipids are invalid.   HDL 43.80 06/27/2016 0959   CHOLHDL 6 06/27/2016 0959    CBC    Component Value Date/Time   WBC 5.2 01/07/2016 1024   RBC 4.81 01/07/2016 1024   HGB 15.4 01/07/2016 1024   HCT 44.6 01/07/2016 1024   PLT 193.0 01/07/2016 1024   MCV 92.7 01/07/2016 1024   MCHC 34.5 01/07/2016 1024   RDW 12.9 01/07/2016 1024    Hgb A1C Lab Results  Component Value Date   HGBA1C 5.7 01/07/2016     Assessment and Plan:

## 2016-10-06 MED ORDER — SIMVASTATIN 10 MG PO TABS: 10.0000 mg | ORAL_TABLET | Freq: Every day | ORAL | 3 refills | Status: DC

## 2016-10-06 NOTE — Addendum Note (Signed)
Addended by: Lurlean Nanny on: 10/06/2016 02:26 PM   Modules accepted: Orders

## 2016-10-24 ENCOUNTER — Ambulatory Visit
Admission: EM | Admit: 2016-10-24 | Discharge: 2016-10-24 | Disposition: A | Discharge status: Home / Self Care | Payer: BLUE CROSS/BLUE SHIELD | Attending: Family Medicine | Admitting: Family Medicine

## 2016-10-24 ENCOUNTER — Encounter: Payer: Self-pay | Admitting: Emergency Medicine

## 2016-10-24 ENCOUNTER — Other Ambulatory Visit: Payer: Self-pay | Admitting: Physician Assistant

## 2016-10-24 DIAGNOSIS — B9789 Other viral agents as the cause of diseases classified elsewhere: Secondary | ICD-10-CM

## 2016-10-24 DIAGNOSIS — J069 Acute upper respiratory infection, unspecified: Secondary | ICD-10-CM | POA: Diagnosis not present

## 2016-10-24 DIAGNOSIS — E782 Mixed hyperlipidemia: Secondary | ICD-10-CM

## 2016-10-24 MED ORDER — GUAIFENESIN-CODEINE 100-10 MG/5ML PO SOLN: ORAL | 0 refills | Status: DC

## 2016-10-24 NOTE — ED Provider Notes (Signed)
[facility]    CSN: [id number] Arrival date & time: 10/24/16  1714     History   Chief Complaint Chief Complaint  Patient presents with  . Cough    HPI Kevin Graham is a 46 y.o. male.   The history is provided by the patient.  Cough  Associated symptoms: rhinorrhea and sore throat   Associated symptoms: no wheezing   URI  Presenting symptoms: congestion, cough, rhinorrhea and sore throat   Severity:  Moderate Onset quality:  Sudden Duration:  3 days Timing:  Constant Progression:  Unchanged Chronicity:  New Relieved by:  None tried Associated symptoms: sneezing   Associated symptoms: no wheezing   Risk factors: sick contacts   Risk factors: not elderly, no chronic cardiac disease, no chronic kidney disease, no chronic respiratory disease, no diabetes mellitus, no immunosuppression, no recent illness and no recent travel     Past Medical History:  Diagnosis Date  . Environmental allergies   . History of stomach ulcers   . Seasonal allergies     Patient Active Problem List   Diagnosis Date Noted  . Elevated cholesterol with high triglycerides 02/04/2016  . Gastroesophageal reflux disease without esophagitis 01/07/2016  . Tobacco use disorder 01/01/2015  . Environmental allergies 01/01/2015    Past Surgical History:  Procedure Laterality Date  . NO PAST SURGERIES         Home Medications    Prior to Admission medications   Medication Sig Start Date End Date Taking? Authorizing Provider  azelastine (OPTIVAR) 0.05 % ophthalmic solution As needed 01/15/16   Historical Provider, MD  cetirizine (ZYRTEC) 10 MG tablet Take 10 mg by mouth daily as needed for allergies.    Historical Provider, MD  famotidine (PEPCID) 10 MG tablet Take 10 mg by mouth 2 (two) times daily.    Historical Provider, MD  fenofibrate 160 MG tablet take 1 tablet by mouth once daily 07/17/16   Brunetta Jeans, PA-C  fluticasone [facility]) 50 MCG/ACT nasal spray Place 1 spray  into both nostrils 2 (two) times daily as needed. 12/10/15   Historical Provider, MD  guaiFENesin-codeine 100-10 MG/5ML syrup 10 ml po qhs prn cough 10/24/16   Norval Gable, MD  simvastatin (ZOCOR) 10 MG tablet Take 1 tablet (10 mg total) by mouth at bedtime. 10/06/16   Jearld Fenton, NP    Family History Family History  Problem Relation Age of Onset  . Hyperlipidemia Mother     Social History Social History  Substance Use Topics  . Smoking status: Current Every Day Smoker    Packs/day: 0.25    Years: 38.00    Types: Cigarettes  . Smokeless tobacco: Never Used     Comment: 2 packs per week  . Alcohol use 1.8 oz/week    3 Cans of beer per week     Comment: 1-3 beers during the week, more on the weekends     Allergies   Patient has no known allergies.   Review of Systems Review of Systems  HENT: Positive for congestion, rhinorrhea, sneezing and sore throat.   Respiratory: Positive for cough. Negative for wheezing.      Physical Exam Triage Vital Signs ED Triage Vitals  Enc Vitals Group     BP 10/24/16 1737 140/74     Pulse Rate 10/24/16 1737 93     Resp 10/24/16 1737 16     Temp 10/24/16 1737 98.4 F (36.9 C)     Temp Source 10/24/16 1737  Oral     SpO2 10/24/16 1737 100 %     Weight 10/24/16 1736 166 lb (75.3 kg)     Height 10/24/16 1736 5\' 3"  (1.6 m)     Head Circumference --      Peak Flow --      Pain Score 10/24/16 1737 0     Pain Loc --      Pain Edu? --      Excl. in [place]? --    No data found.   Updated Vital Signs BP 140/74 (BP Location: Right Arm)   Pulse 93   Temp 98.4 F (36.9 C) (Oral)   Resp 16   Ht 5\' 3"  (1.6 m)   Wt 166 lb (75.3 kg)   SpO2 100%   BMI 29.41 kg/m   Visual Acuity Right Eye Distance:   Left Eye Distance:   Bilateral Distance:    Right Eye Near:   Left Eye Near:    Bilateral Near:     Physical Exam  Constitutional: He appears well-developed and well-nourished. No distress.  HENT:  Head: Normocephalic and  atraumatic.  Right Ear: Tympanic membrane, external ear and ear canal normal.  Left Ear: Tympanic membrane, external ear and ear canal normal.  Nose: Nose normal.  Mouth/Throat: Uvula is midline, oropharynx is clear and moist and mucous membranes are normal. No oropharyngeal exudate or tonsillar abscesses.  Eyes: Conjunctivae and EOM are normal. Pupils are equal, round, and reactive to light. Right eye exhibits no discharge. Left eye exhibits no discharge. No scleral icterus.  Neck: Normal range of motion. Neck supple. No tracheal deviation present. No thyromegaly present.  Cardiovascular: Normal rate, regular rhythm and normal heart sounds.   Pulmonary/Chest: Effort normal and breath sounds normal. No stridor. No respiratory distress. He has no wheezes. He has no rales. He exhibits no tenderness.  Lymphadenopathy:    He has no cervical adenopathy.  Neurological: He is alert.  Skin: Skin is warm and dry. No rash noted. He is not diaphoretic.  Nursing note and vitals reviewed.    UC Treatments / Results  Labs (all labs ordered are listed, but only abnormal results are displayed) Labs Reviewed - No data to display  EKG  EKG Interpretation None       Radiology No results found.  Procedures Procedures (including critical care time)  Medications Ordered in UC Medications - No data to display   Initial Impression / Assessment and Plan / UC Course  I have reviewed the triage vital signs and the nursing notes.  Pertinent labs & imaging results that were available during my care of the patient were reviewed by me and considered in my medical decision making (see chart for details).       Final Clinical Impressions(s) / UC Diagnoses   Final diagnoses:  Viral URI with cough    New Prescriptions Discharge Medication List as of 10/24/2016  6:12 PM    START taking these medications   Details  guaiFENesin-codeine 100-10 MG/5ML syrup 10 ml po qhs prn cough, Print        1. diagnosis reviewed with patient 2. rx as per orders above; reviewed possible side effects, interactions, risks and benefits  3. Recommend supportive treatment with fluids, rest 4. Follow-up prn if symptoms worsen or don't improve   Norval Gable, MD 10/24/16 [phone]

## 2016-10-24 NOTE — ED Triage Notes (Signed)
Patient c/o cough, congestion and runny nose for the past 3 days.  

## 2016-10-25 NOTE — Telephone Encounter (Signed)
Will defer further refills of patient's medications to PCP  

## 2016-10-31 ENCOUNTER — Ambulatory Visit: Payer: BLUE CROSS/BLUE SHIELD | Admitting: Neurology

## 2017-01-08 ENCOUNTER — Encounter: Payer: BLUE CROSS/BLUE SHIELD | Admitting: Internal Medicine

## 2017-01-16 ENCOUNTER — Encounter: Payer: Self-pay | Admitting: Internal Medicine

## 2017-01-16 ENCOUNTER — Ambulatory Visit (INDEPENDENT_AMBULATORY_CARE_PROVIDER_SITE_OTHER): Payer: BLUE CROSS/BLUE SHIELD | Admitting: Internal Medicine

## 2017-01-16 VITALS — BP 114/74 | HR 70 | Wt 166.4 lb

## 2017-01-16 DIAGNOSIS — Z0000 Encounter for general adult medical examination without abnormal findings: Secondary | ICD-10-CM

## 2017-01-16 DIAGNOSIS — E782 Mixed hyperlipidemia: Secondary | ICD-10-CM | POA: Diagnosis not present

## 2017-01-16 DIAGNOSIS — Z114 Encounter for screening for human immunodeficiency virus [HIV]: Secondary | ICD-10-CM

## 2017-01-16 LAB — COMPREHENSIVE METABOLIC PANEL
ALT: 48 U/L (ref 0–53)
AST: 29 U/L (ref 0–37)
Albumin: 4.6 g/dL (ref 3.5–5.2)
Alkaline Phosphatase: 41 U/L (ref 39–117)
BUN: 14 mg/dL (ref 6–23)
CHLORIDE: 104 meq/L (ref 96–112)
CO2: 27 mEq/L (ref 19–32)
Calcium: 10.6 mg/dL — ABNORMAL HIGH (ref 8.4–10.5)
Creatinine, Ser: 1.14 mg/dL (ref 0.40–1.50)
GFR: 73.62 mL/min (ref >60.00)
GLUCOSE: 91 mg/dL (ref 70–99)
Potassium: 4.3 mEq/L (ref 3.5–5.1)
SODIUM: 136 meq/L (ref 135–145)
Total Bilirubin: 0.4 mg/dL (ref 0.2–1.2)
Total Protein: 7.4 g/dL (ref 6.0–8.3)

## 2017-01-16 LAB — LIPID PANEL
CHOL/HDL RATIO: 6
Cholesterol: 278 mg/dL — ABNORMAL HIGH (ref 0–200)
HDL: 43.2 mg/dL (ref >39.00)
Triglycerides: 504 mg/dL — ABNORMAL HIGH (ref 0.0–149.0)

## 2017-01-16 LAB — CBC
HEMATOCRIT: 44.2 % (ref 39.0–52.0)
Hemoglobin: 15.1 g/dL (ref 13.0–17.0)
MCHC: 34.2 g/dL (ref 30.0–36.0)
MCV: 94.7 fl (ref 78.0–100.0)
Platelets: 208 10*3/uL (ref 150.0–400.0)
RBC: 4.66 Mil/uL (ref 4.22–5.81)
RDW: 13.1 % (ref 11.5–15.5)
WBC: 5.3 10*3/uL (ref 4.0–10.5)

## 2017-01-16 LAB — LDL CHOLESTEROL, DIRECT: LDL DIRECT: 161 mg/dL

## 2017-01-16 LAB — HEMOGLOBIN A1C: Hgb A1c MFr Bld: 5.8 % (ref 4.6–6.5)

## 2017-01-16 NOTE — Patient Instructions (Signed)
 Health Maintenance, Male A healthy lifestyle and preventive care is important for your health and wellness. Ask your health care provider about what schedule of regular examinations is right for you. What should I know about weight and diet?  Eat a Healthy Diet  Eat plenty of vegetables, fruits, whole grains, low-fat dairy products, and lean protein.  Do not eat a lot of foods high in solid fats, added sugars, or salt. Maintain a Healthy Weight  Regular exercise can help you achieve or maintain a healthy weight. You should:  Do at least 150 minutes of exercise each week. The exercise should increase your heart rate and make you sweat (moderate-intensity exercise).  Do strength-training exercises at least twice a week. Watch Your Levels of Cholesterol and Blood Lipids  Have your blood tested for lipids and cholesterol every 5 years starting at 46 years of age. If you are at high risk for heart disease, you should start having your blood tested when you are 46 years old. You may need to have your cholesterol levels checked more often if:  Your lipid or cholesterol levels are high.  You are older than 46 years of age.  You are at high risk for heart disease. What should I know about cancer screening? Many types of cancers can be detected early and may often be prevented. Lung Cancer  You should be screened every year for lung cancer if:  You are a current smoker who has smoked for at least 30 years.  You are a former smoker who has quit within the past 15 years.  Talk to your health care provider about your screening options, when you should start screening, and how often you should be screened. Colorectal Cancer  Routine colorectal cancer screening usually begins at 46 years of age and should be repeated every 5-10 years until you are 46 years old. You may need to be screened more often if early forms of precancerous polyps or small growths are found. Your health care provider  may recommend screening at an earlier age if you have risk factors for colon cancer.  Your health care provider may recommend using home test kits to check for hidden blood in the stool.  A small camera at the end of a tube can be used to examine your colon (sigmoidoscopy or colonoscopy). This checks for the earliest forms of colorectal cancer. Prostate and Testicular Cancer  Depending on your age and overall health, your health care provider may do certain tests to screen for prostate and testicular cancer.  Talk to your health care provider about any symptoms or concerns you have about testicular or prostate cancer. Skin Cancer  Check your skin from head to toe regularly.  Tell your health care provider about any new moles or changes in moles, especially if:  There is a change in a mole's size, shape, or color.  You have a mole that is larger than a pencil eraser.  Always use sunscreen. Apply sunscreen liberally and repeat throughout the day.  Protect yourself by wearing long sleeves, pants, a wide-brimmed hat, and sunglasses when outside. What should I know about heart disease, diabetes, and high blood pressure?  If you are 18-39 years of age, have your blood pressure checked every 3-5 years. If you are 40 years of age or older, have your blood pressure checked every year. You should have your blood pressure measured twice-once when you are at a hospital or clinic, and once when you are not at   a hospital or clinic. Record the average of the two measurements. To check your blood pressure when you are not at a hospital or clinic, you can use:  An automated blood pressure machine at a pharmacy.  A home blood pressure monitor.  Talk to your health care provider about your target blood pressure.  If you are between 45-79 years old, ask your health care provider if you should take aspirin to prevent heart disease.  Have regular diabetes screenings by checking your fasting blood sugar  level.  If you are at a normal weight and have a low risk for diabetes, have this test once every three years after the age of 45.  If you are overweight and have a high risk for diabetes, consider being tested at a younger age or more often.  A one-time screening for abdominal aortic aneurysm (AAA) by ultrasound is recommended for men aged 65-75 years who are current or former smokers. What should I know about preventing infection? Hepatitis B  If you have a higher risk for hepatitis B, you should be screened for this virus. Talk with your health care provider to find out if you are at risk for hepatitis B infection. Hepatitis C  Blood testing is recommended for:  Everyone born from 1945 through 1965.  Anyone with known risk factors for hepatitis C. Sexually Transmitted Diseases (STDs)  You should be screened each year for STDs including gonorrhea and chlamydia if:  You are sexually active and are younger than 46 years of age.  You are older than 46 years of age and your health care provider tells you that you are at risk for this type of infection.  Your sexual activity has changed since you were last screened and you are at an increased risk for chlamydia or gonorrhea. Ask your health care provider if you are at risk.  Talk with your health care provider about whether you are at high risk of being infected with HIV. Your health care provider may recommend a prescription medicine to help prevent HIV infection. What else can I do?  Schedule regular health, dental, and eye exams.  Stay current with your vaccines (immunizations).  Do not use any tobacco products, such as cigarettes, chewing tobacco, and e-cigarettes. If you need help quitting, ask your health care provider.  Limit alcohol intake to no more than 2 drinks per day. One drink equals 12 ounces of beer, 5 ounces of wine, or 1 ounces of hard liquor.  Do not use street drugs.  Do not share needles.  Ask your health  care provider for help if you need support or information about quitting drugs.  Tell your health care provider if you often feel depressed.  Tell your health care provider if you have ever been abused or do not feel safe at home. This information is not intended to replace advice given to you by your health care provider. Make sure you discuss any questions you have with your health care provider. Document Released: 03/13/2008 Document Revised: 05/14/2016 Document Reviewed: 06/19/2015 Elsevier Interactive Patient Education  2017 [employer]  

## 2017-01-16 NOTE — Progress Notes (Signed)
Subjective:    Patient ID: Kevin Graham, male    DOB: 05-26-1971, 46 y.o.   MRN: [id number]  HPI  Pt presents to the clinic today for her annual exam.  Flu: 06/2016 Tetanus: 05/2016 Vision Screening: as needed Dentist: biannually  Diet: He does eat meat. He consumes fruits and veggies daily. He does eat fried food. He drinks coffee and water throughout the day.  Exercise: None  Review of Systems      Past Medical History:  Diagnosis Date  . Environmental allergies   . History of stomach ulcers   . Seasonal allergies     Current Outpatient Prescriptions  Medication Sig Dispense Refill  . azelastine (OPTIVAR) 0.05 % ophthalmic solution As needed  0  . cetirizine (ZYRTEC) 10 MG tablet Take 10 mg by mouth daily as needed for allergies.    . famotidine (PEPCID) 10 MG tablet Take 10 mg by mouth 2 (two) times daily.    . fenofibrate 160 MG tablet take 1 tablet by mouth once daily 30 tablet 2  . fluticasone (FLONASE) 50 MCG/ACT nasal spray Place 1 spray into both nostrils 2 (two) times daily as needed.  0  . guaiFENesin-codeine 100-10 MG/5ML syrup 10 ml po qhs prn cough 120 mL 0  . simvastatin (ZOCOR) 10 MG tablet Take 1 tablet (10 mg total) by mouth at bedtime. 30 tablet 3   No current facility-administered medications for this visit.     No Known Allergies  Family History  Problem Relation Age of Onset  . Hyperlipidemia Mother     Social History   Social History  . Marital status: Single    Spouse name: N/A  . Number of children: N/A  . Years of education: N/A   Occupational History  . Not on file.   Social History Main Topics  . Smoking status: Current Every Day Smoker    Packs/day: 0.25    Years: 38.00    Types: Cigarettes  . Smokeless tobacco: Never Used     Comment: 2 packs per week  . Alcohol use 1.8 oz/week    3 Cans of beer per week     Comment: 1-3 beers during the week, more on the weekends  . Drug use: No  . Sexual activity: Yes    Partners:  Female   Other Topics Concern  . Not on file   Social History Narrative   Lives with girlfriend and her child in a one bedroom home.  Has 5 children.  Works at [employer].  Education: high school.     Constitutional: Denies fever, malaise, fatigue, headache or abrupt weight changes.  HEENT: Denies eye pain, eye redness, ear pain, ringing in the ears, wax buildup, runny nose, nasal congestion, bloody nose, or sore throat. Respiratory: Denies difficulty breathing, shortness of breath, cough or sputum production.   Cardiovascular: Denies chest pain, chest tightness, palpitations or swelling in the hands or feet.  Gastrointestinal: Pt reports intermittent GERD. Denies abdominal pain, bloating, constipation, diarrhea or blood in the stool.  GU: Denies urgency, frequency, pain with urination, burning sensation, blood in urine, odor or discharge. Musculoskeletal: Denies decrease in range of motion, difficulty with gait, muscle pain or joint pain and swelling.  Skin: Denies redness, rashes, lesions or ulcercations.  Neurological: Denies dizziness, difficulty with memory, difficulty with speech or problems with balance and coordination.  Psych: Denies anxiety, depression, SI/HI.  No other specific complaints in a complete review of systems (except as listed in HPI  above).  Objective:   Physical Exam   BP 114/74   Pulse 70   Wt 166 lb 6.4 oz (75.5 kg)   SpO2 98%   BMI 29.48 kg/m  Wt Readings from Last 3 Encounters:  01/16/17 166 lb 6.4 oz (75.5 kg)  10/24/16 166 lb (75.3 kg)  10/02/16 166 lb (75.3 kg)    General: Appears her stated age, well developed, well nourished in NAD. Skin: Warm, dry and intact.  HEENT: Head: normal shape and size; Eyes: sclera white, no icterus, conjunctiva pink, PERRLA and EOMs intact; Ears: Tm's gray and intact, normal light reflex; Throat/Mouth: Teeth present, mucosa pink and moist, no exudate, lesions or ulcerations noted.  Neck:  Neck supple, trachea  midline. No masses, lumps or thyromegaly present.  Cardiovascular: Normal rate and rhythm. S1,S2 noted.  No murmur, rubs or gallops noted. No JVD or BLE edema. No carotid bruits noted. Pulmonary/Chest: Normal effort and positive vesicular breath sounds. No respiratory distress. No wheezes, rales or ronchi noted.  Abdomen: Soft and nontender. Normal bowel sounds. No distention or masses noted. Liver, spleen and kidneys non palpable. Musculoskeletal: Strength 5/5 BUE/BLE. No difficulty with gait.  Neurological: Alert and oriented. Cranial nerves II-XII grossly intact. Coordination normal.  Psychiatric: Mood and affect normal. Behavior is normal. Judgment and thought content normal.    BMET    Component Value Date/Time   NA 140 10/02/2016 1129   K 4.5 10/02/2016 1129   CL 106 10/02/2016 1129   CO2 31 10/02/2016 1129   GLUCOSE 102 (H) 10/02/2016 1129   BUN 13 10/02/2016 1129   CREATININE 1.03 10/02/2016 1129   CALCIUM 10.3 10/02/2016 1129    Lipid Panel     Component Value Date/Time   CHOL 235 (H) 10/02/2016 1129   TRIG 198.0 (H) 10/02/2016 1129   HDL 46.10 10/02/2016 1129   CHOLHDL 5 10/02/2016 1129   VLDL 39.6 10/02/2016 1129   LDLCALC 149 (H) 10/02/2016 1129    CBC    Component Value Date/Time   WBC 5.2 01/07/2016 1024   RBC 4.81 01/07/2016 1024   HGB 15.4 01/07/2016 1024   HCT 44.6 01/07/2016 1024   PLT 193.0 01/07/2016 1024   MCV 92.7 01/07/2016 1024   MCHC 34.5 01/07/2016 1024   RDW 12.9 01/07/2016 1024    Hgb A1C Lab Results  Component Value Date   HGBA1C 5.7 01/07/2016           Assessment & Plan:   Preventative Health Maintenance:  Flu and tetanus UTD Encouraged him to consume a balanced diet and exercise regimen Advised him to see an eye doctor and dentist annually Will check CBC, CMET, Lipid, A1C and HIV today  RTC in year, sooner if needed Webb Silversmith, NP

## 2017-01-19 LAB — HIV ANTIBODY (ROUTINE TESTING W REFLEX): HIV: NONREACTIVE

## 2017-01-21 MED ORDER — SIMVASTATIN 40 MG PO TABS: 40.0000 mg | ORAL_TABLET | Freq: Every day | ORAL | 3 refills | Status: DC

## 2017-01-21 NOTE — Addendum Note (Signed)
Addended by: Lurlean Nanny on: 01/21/2017 02:33 PM   Modules accepted: Orders

## 2017-02-08 ENCOUNTER — Other Ambulatory Visit: Payer: Self-pay | Admitting: Physician Assistant

## 2017-02-12 ENCOUNTER — Telehealth: Payer: Self-pay | Admitting: Internal Medicine

## 2017-02-12 DIAGNOSIS — E782 Mixed hyperlipidemia: Secondary | ICD-10-CM

## 2017-02-12 MED ORDER — FENOFIBRATE 160 MG PO TABS: 160.0000 mg | ORAL_TABLET | Freq: Every day | ORAL | 2 refills | Status: DC

## 2017-02-12 NOTE — Telephone Encounter (Signed)
Rx sent through e-scribe  

## 2017-02-12 NOTE — Telephone Encounter (Signed)
Patient is in office to request refill for cholesterol medicine. It is fenofibrate 160 MG tablet.  His pharmacy is [facility] [place], [place] - [address].  Let me know if appt needs to be scheduled.  Thank you,  -LLL

## 2017-04-06 ENCOUNTER — Encounter: Payer: Self-pay | Admitting: Emergency Medicine

## 2017-04-06 DIAGNOSIS — R202 Paresthesia of skin: Secondary | ICD-10-CM | POA: Diagnosis not present

## 2017-04-06 DIAGNOSIS — Z79899 Other long term (current) drug therapy: Secondary | ICD-10-CM | POA: Insufficient documentation

## 2017-04-06 DIAGNOSIS — F17210 Nicotine dependence, cigarettes, uncomplicated: Secondary | ICD-10-CM | POA: Diagnosis not present

## 2017-04-06 DIAGNOSIS — R531 Weakness: Secondary | ICD-10-CM | POA: Diagnosis not present

## 2017-04-06 DIAGNOSIS — R200 Anesthesia of skin: Secondary | ICD-10-CM | POA: Diagnosis present

## 2017-04-06 LAB — URINALYSIS, COMPLETE (UACMP) WITH MICROSCOPIC
Bacteria, UA: NONE SEEN
Bilirubin Urine: NEGATIVE
GLUCOSE, UA: NEGATIVE mg/dL
HGB URINE DIPSTICK: NEGATIVE
KETONES UR: NEGATIVE mg/dL
LEUKOCYTES UA: NEGATIVE
Nitrite: NEGATIVE
PROTEIN: NEGATIVE mg/dL
SQUAMOUS EPITHELIAL / LPF: NONE SEEN
Specific Gravity, Urine: 1.006 (ref 1.005–1.030)
pH: 6 (ref 5.0–8.0)

## 2017-04-06 LAB — BASIC METABOLIC PANEL
ANION GAP: 9 (ref 5–15)
BUN: 18 mg/dL (ref 6–20)
CHLORIDE: 104 mmol/L (ref 101–111)
CO2: 25 mmol/L (ref 22–32)
Calcium: 10.7 mg/dL — ABNORMAL HIGH (ref 8.9–10.3)
Creatinine, Ser: 0.77 mg/dL (ref 0.61–1.24)
GFR calc non Af Amer: >60 mL/min (ref >60)
Glucose, Bld: 105 mg/dL — ABNORMAL HIGH (ref 65–99)
POTASSIUM: 4 mmol/L (ref 3.5–5.1)
SODIUM: 138 mmol/L (ref 135–145)

## 2017-04-06 LAB — CBC
HCT: 43.2 % (ref 40.0–52.0)
HEMOGLOBIN: 15.3 g/dL (ref 13.0–18.0)
MCH: 33.1 pg (ref 26.0–34.0)
MCHC: 35.4 g/dL (ref 32.0–36.0)
MCV: 93.5 fL (ref 80.0–100.0)
Platelets: 208 10*3/uL (ref 150–440)
RBC: 4.62 MIL/uL (ref 4.40–5.90)
RDW: 12.6 % (ref 11.5–14.5)
WBC: 7.6 10*3/uL (ref 3.8–10.6)

## 2017-04-06 LAB — TROPONIN I: Troponin I: <0.03 ng/mL (ref <0.03)

## 2017-04-06 NOTE — ED Triage Notes (Signed)
Pt to triage in wheelchair. Pt reports he has intermittent episodes of weakness and dizziness since 1400 today. Pt denies SOB or chest pain at this time. Pt sts he feel like he is going to "pass out" when the episodes occur.

## 2017-04-07 ENCOUNTER — Emergency Department: Payer: BLUE CROSS/BLUE SHIELD

## 2017-04-07 ENCOUNTER — Encounter: Payer: Self-pay | Admitting: Radiology

## 2017-04-07 ENCOUNTER — Emergency Department
Admission: EM | Admit: 2017-04-07 | Discharge: 2017-04-07 | Disposition: A | Discharge status: Home / Self Care | Payer: BLUE CROSS/BLUE SHIELD | Attending: Emergency Medicine | Admitting: Emergency Medicine

## 2017-04-07 DIAGNOSIS — R531 Weakness: Secondary | ICD-10-CM

## 2017-04-07 DIAGNOSIS — R200 Anesthesia of skin: Secondary | ICD-10-CM

## 2017-04-07 DIAGNOSIS — R202 Paresthesia of skin: Secondary | ICD-10-CM

## 2017-04-07 MED ORDER — IOPAMIDOL (ISOVUE-370) INJECTION 76%
100.0000 mL | Freq: Once | INTRAVENOUS | Status: AC | PRN
  Administered 2017-04-07: 100 mL via INTRAVENOUS

## 2017-04-07 NOTE — ED Notes (Signed)
Pt sitting in chair, no change in symptoms

## 2017-04-07 NOTE — ED Provider Notes (Signed)
[facility] Emergency Department Provider Note   ____________________________________________   First MD Initiated Contact with Patient 04/07/17 0153     (approximate)  I have reviewed the triage vital signs and the nursing notes.   HISTORY  Chief Complaint Numbness and Weakness    HPI Kevin Graham is a 46 y.o. male who comes into the hospital today with multiple complaints.He reports that he had some numbness to the left side of his body and some shortness of breath. The patient had some dizziness as well. He reports that the symptoms started about 3 PM. He was in [employer] and then felt numbness coming up from his leg going into his head and then he felt a little short of breath. He reports that it went away and then he had another similar symptom. He felt weak as well and felt as though he couldn't open his mouth or move his face. He reports that he came in at that point. The patient reports that when he came in he also felt as though he wasn't speaking normally. His symptoms lasted for approximately 30-45 minutes. He reports that he still feels some mild numbness on his left side. The patient also has some mild weakness all over. The patient states that he's never had symptoms like this before. 2 weeks ago he had some cramps in his right calf and he reports that he stopped taking his cholesterol medication 2 weeks ago. He has no chest pain.   Past Medical History:  Diagnosis Date  . Environmental allergies   . History of stomach ulcers   . Seasonal allergies     Patient Active Problem List   Diagnosis Date Noted  . Elevated cholesterol with high triglycerides 02/04/2016  . Gastroesophageal reflux disease without esophagitis 01/07/2016  . Tobacco use disorder 01/01/2015  . Environmental allergies 01/01/2015    Past Surgical History:  Procedure Laterality Date  . NO PAST SURGERIES      Prior to Admission medications   Medication Sig Start Date  End Date Taking? Authorizing Provider  azelastine (OPTIVAR) 0.05 % ophthalmic solution As needed 01/15/16  Yes [provider]  cetirizine (ZYRTEC) 10 MG tablet Take 10 mg by mouth daily as needed for allergies.   Yes [provider]  famotidine (PEPCID) 10 MG tablet Take 10 mg by mouth 2 (two) times daily.   Yes [provider]  fenofibrate 160 MG tablet Take 1 tablet (160 mg total) by mouth daily. 02/12/17  Yes Baity, Coralie Keens, NP  fluticasone (FLONASE) 50 MCG/ACT nasal spray Place 1 spray into both nostrils 2 (two) times daily as needed. 12/10/15  Yes [provider]  pantoprazole (PROTONIX) 40 MG tablet take 1 tablet by mouth once daily 02/09/17  Yes Baity, Coralie Keens, NP  simvastatin (ZOCOR) 40 MG tablet Take 1 tablet (40 mg total) by mouth at bedtime. 01/21/17  Yes Jearld Fenton, NP  guaiFENesin-codeine 100-10 MG/5ML syrup 10 ml po qhs prn cough Patient not taking: Reported on 01/16/2017 10/24/16   Norval Gable, MD    Allergies Patient has no known allergies.  Family History  Problem Relation Age of Onset  . Hyperlipidemia Mother     Social History Social History  Substance Use Topics  . Smoking status: Current Every Day Smoker    Packs/day: 0.25    Years: 38.00    Types: Cigarettes  . Smokeless tobacco: Never Used     Comment: 2 packs per week  . Alcohol  use 1.8 oz/week    3 Cans of beer per week     Comment: 1-3 beers during the week, more on the weekends    Review of Systems  Constitutional: No fever/chills Eyes: No visual changes. ENT: No sore throat. Cardiovascular: Denies chest pain. Respiratory:  shortness of breath. Gastrointestinal: No abdominal pain.  No nausea, no vomiting.  No diarrhea.  No constipation. Genitourinary: Negative for dysuria. Musculoskeletal: Negative for back pain. Skin: Negative for rash. Neurological: Left-sided numbness and generalized  weakness   ____________________________________________   PHYSICAL EXAM:  VITAL SIGNS: ED Triage Vitals  Enc Vitals Group     BP 04/06/17 2011 (!) 133/99     Pulse Rate 04/06/17 2011 98     Resp 04/06/17 2011 (!) 22     Temp 04/06/17 2011 97.6 F (36.4 C)     Temp Source 04/06/17 2011 Oral     SpO2 04/06/17 2011 100 %     Weight 04/06/17 2012 165 lb (74.8 kg)     Height 04/06/17 2012 5\' 4"  (1.626 m)     Head Circumference --      Peak Flow --      Pain Score --      Pain Loc --      Pain Edu? --      Excl. in [place]? --     Constitutional: Alert and oriented. Well appearing and in Mild distress. Eyes: Conjunctivae are normal. PERRL. EOMI. Head: Atraumatic. Nose: No congestion/rhinnorhea. Mouth/Throat: Mucous membranes are moist.  Oropharynx non-erythematous. Cardiovascular: Normal rate, regular rhythm. Grossly normal heart sounds.  Good peripheral circulation. Respiratory: Normal respiratory effort.  No retractions. Lungs CTAB. Gastrointestinal: Soft and nontender. No distention. Positive bowel sounds Musculoskeletal: No lower extremity tenderness nor edema.   Neurologic:  Normal speech and language. Cranial nerves II through XII are grossly intact with no focal motor or neuro deficits. The patient reports that he has some decreased sensation in his left upper and left lower extremity. The patient has no pronator drift and no difficulty with alternating finger movements or with finger to nose. Strength is 5 out of 5 in his upper and lower extremities. Skin:  Skin is warm, dry and intact. No rash noted. Psychiatric: Mood and affect are normal. Speech and behavior are normal.  ____________________________________________   LABS (all labs ordered are listed, but only abnormal results are displayed)  Labs Reviewed  BASIC METABOLIC PANEL - Abnormal; Notable for the following:       Result Value   Glucose, Bld 105 (*)    Calcium 10.7 (*)    All other components within normal  limits  URINALYSIS, COMPLETE (UACMP) WITH MICROSCOPIC - Abnormal; Notable for the following:    Color, Urine STRAW (*)    APPearance CLEAR (*)    All other components within normal limits  CBC  TROPONIN I  CBG MONITORING, ED   ____________________________________________  EKG  ED ECG REPORT I, Loney Hering, the attending physician, personally viewed and interpreted this ECG.   Date: 04/06/2017  EKG Time: 2024  Rate: 81  Rhythm: normal sinus rhythm  Axis: normal  Intervals:none  ST&T Change: none  ____________________________________________  RADIOLOGY  Ct Angio Head W Or Wo Contrast  Result Date: 04/07/2017 CLINICAL DATA:  Initial evaluation for intermittent episodes of weakness and dizziness, left-sided numbness, slurred speech. EXAM: CT ANGIOGRAPHY HEAD AND NECK TECHNIQUE: Multidetector CT imaging of the head and neck was performed using the standard protocol during bolus administration  of intravenous contrast. Multiplanar CT image reconstructions and MIPs were obtained to evaluate the vascular anatomy. Carotid stenosis measurements (when applicable) are obtained utilizing NASCET criteria, using the distal internal carotid diameter as the denominator. CONTRAST:  100 cc of Isovue 370. COMPARISON:  None. FINDINGS: CT HEAD FINDINGS Brain: Mildly advanced cerebral atrophy for patient age. There are a few scattered hypodensities position within the deep/subcortical white matter of the bilateral parietal lobes (series 5, image 17), nonspecific. No evidence for acute intracranial hemorrhage. No evidence for acute large vessel territory infarct. No mass lesion, midline shift or mass effect. No hydrocephalus. No extra-axial fluid collection. Vascular: No hyperdense vessel. Skull: Scalp soft tissues within normal limits.Calvarium intact. Sinuses/Orbits: Globes and orbital soft tissues are within normal limits. Visualized paranasal sinuses are clear. No mastoid effusion. CTA NECK FINDINGS  Aortic arch: Visualized aortic arch of normal caliber with normal 3 vessel morphology. Minimal atheromatous plaque within the proximal descending intrathoracic aorta. No flow-limiting stenosis about the origin of the great vessels. Visualized subclavian artery is widely patent. Right carotid system: Right common and internal carotid artery's widely patent without stenosis, dissection, or occlusion. No flow-limiting stenosis about the right carotid bifurcation para Left carotid system: Left common and internal carotid artery's widely patent without stenosis, dissection, or occlusion. Minimal calcified plaque at the proximal left ICA without stenosis. Vertebral arteries: Both of the vertebral arteries arise from the subclavian arteries. Vertebral arteries widely patent within the neck without stenosis, dissection, or occlusion. Skeleton: No acute osseous abnormality. No worrisome lytic or blastic osseous lesions. Mild multilevel facet arthropathy noted within the cervical spine. Other neck: No acute soft tissue abnormality within the neck. Salivary glands normal. Thyroid normal. No adenopathy. Upper chest: Visualized upper chest within normal limits. Partially visualized lungs are clear. Review of the MIP images confirms the above findings CTA HEAD FINDINGS Anterior circulation: Petrous, cavernous, and supraclinoid segments widely patent bilaterally without stenosis. ICA termini widely patent. Left A1 segment dominant and widely patent. Right A1 segment hypoplastic, which likely accounts for the slightly diminutive right ICA is compared to the left. Anterior communicating artery within normal limits. Anterior cerebral arteries patent to their distal aspects. M1 segments patent without stenosis or occlusion. No proximal M2 occlusion. Distal MCA branches well opacified and symmetric. Posterior circulation: Vertebral artery's patent to the vertebrobasilar junction without stenosis. Left vertebral artery slightly  dominant. Posterior inferior cerebral arteries patent bilaterally. Basilar artery widely patent. Superior cerebellar is patent bilaterally. Both of the posterior cerebral arteries primarily supplied via the basilar and are widely patent to their distal aspects. Venous sinuses: Patent. Anatomic variants: No significant anatomic variant. No aneurysm or vascular malformation. Delayed phase: No pathologic enhancement. Review of the MIP images confirms the above findings IMPRESSION: 1. Normal CTA of the head and neck. 2. No acute intracranial process. 3. Patchy hypodensities within the subcortical/deep white matter of the bilateral parietal lobes. Findings are nonspecific. Primary differential considerations includes accelerated chronic small vessel ischemic disease. Possible demyelinating disease could also be considered. 4. Mildly advanced cerebral atrophy for age. Electronically Signed   By: Jeannine Boga M.D.   On: 04/07/2017 04:06   Ct Angio Neck W And/or Wo Contrast  Result Date: 04/07/2017 CLINICAL DATA:  Initial evaluation for intermittent episodes of weakness and dizziness, left-sided numbness, slurred speech. EXAM: CT ANGIOGRAPHY HEAD AND NECK TECHNIQUE: Multidetector CT imaging of the head and neck was performed using the standard protocol during bolus administration of intravenous contrast. Multiplanar CT image reconstructions and MIPs were  obtained to evaluate the vascular anatomy. Carotid stenosis measurements (when applicable) are obtained utilizing NASCET criteria, using the distal internal carotid diameter as the denominator. CONTRAST:  100 cc of Isovue 370. COMPARISON:  None. FINDINGS: CT HEAD FINDINGS Brain: Mildly advanced cerebral atrophy for patient age. There are a few scattered hypodensities position within the deep/subcortical white matter of the bilateral parietal lobes (series 5, image 17), nonspecific. No evidence for acute intracranial hemorrhage. No evidence for acute large vessel  territory infarct. No mass lesion, midline shift or mass effect. No hydrocephalus. No extra-axial fluid collection. Vascular: No hyperdense vessel. Skull: Scalp soft tissues within normal limits.Calvarium intact. Sinuses/Orbits: Globes and orbital soft tissues are within normal limits. Visualized paranasal sinuses are clear. No mastoid effusion. CTA NECK FINDINGS Aortic arch: Visualized aortic arch of normal caliber with normal 3 vessel morphology. Minimal atheromatous plaque within the proximal descending intrathoracic aorta. No flow-limiting stenosis about the origin of the great vessels. Visualized subclavian artery is widely patent. Right carotid system: Right common and internal carotid artery's widely patent without stenosis, dissection, or occlusion. No flow-limiting stenosis about the right carotid bifurcation para Left carotid system: Left common and internal carotid artery's widely patent without stenosis, dissection, or occlusion. Minimal calcified plaque at the proximal left ICA without stenosis. Vertebral arteries: Both of the vertebral arteries arise from the subclavian arteries. Vertebral arteries widely patent within the neck without stenosis, dissection, or occlusion. Skeleton: No acute osseous abnormality. No worrisome lytic or blastic osseous lesions. Mild multilevel facet arthropathy noted within the cervical spine. Other neck: No acute soft tissue abnormality within the neck. Salivary glands normal. Thyroid normal. No adenopathy. Upper chest: Visualized upper chest within normal limits. Partially visualized lungs are clear. Review of the MIP images confirms the above findings CTA HEAD FINDINGS Anterior circulation: Petrous, cavernous, and supraclinoid segments widely patent bilaterally without stenosis. ICA termini widely patent. Left A1 segment dominant and widely patent. Right A1 segment hypoplastic, which likely accounts for the slightly diminutive right ICA is compared to the left. Anterior  communicating artery within normal limits. Anterior cerebral arteries patent to their distal aspects. M1 segments patent without stenosis or occlusion. No proximal M2 occlusion. Distal MCA branches well opacified and symmetric. Posterior circulation: Vertebral artery's patent to the vertebrobasilar junction without stenosis. Left vertebral artery slightly dominant. Posterior inferior cerebral arteries patent bilaterally. Basilar artery widely patent. Superior cerebellar is patent bilaterally. Both of the posterior cerebral arteries primarily supplied via the basilar and are widely patent to their distal aspects. Venous sinuses: Patent. Anatomic variants: No significant anatomic variant. No aneurysm or vascular malformation. Delayed phase: No pathologic enhancement. Review of the MIP images confirms the above findings IMPRESSION: 1. Normal CTA of the head and neck. 2. No acute intracranial process. 3. Patchy hypodensities within the subcortical/deep white matter of the bilateral parietal lobes. Findings are nonspecific. Primary differential considerations includes accelerated chronic small vessel ischemic disease. Possible demyelinating disease could also be considered. 4. Mildly advanced cerebral atrophy for age. Electronically Signed   By: Jeannine Boga M.D.   On: 04/07/2017 04:06   Mr Brain Wo Contrast  Result Date: 04/07/2017 CLINICAL DATA:  Initial evaluation for left-sided numbness in weakness. EXAM: MRI HEAD WITHOUT CONTRAST TECHNIQUE: Multiplanar, multiecho pulse sequences of the brain and surrounding structures were obtained without intravenous contrast. COMPARISON:  Prior CTA from earlier same day. FINDINGS: Brain: Mild diffuse prominence of the CSF containing spaces is compatible with generalized cerebral atrophy, mildly advanced for age. PA tiny subcentimeter T2/FLAIR  hyperintense foci noted within the periventricular, deep, and subcortical white matter both cerebral hemispheres, nonspecific,  but relatively mild for age. Foci such as these are most commonly related to chronic microvascular ischemic disease. Multiple prominent dilated perivascular space is present within the deep/subcortical white matter of the bilateral parietal lobes. These correspond with previously noted hypodensity seen on previous CT. These are of doubtful significance. No abnormal foci of restricted diffusion to suggest acute or subacute ischemia. Gray-white matter differentiation maintained. No evidence for chronic infarction. No evidence for acute or chronic intracranial hemorrhage. No mass lesion, midline shift or mass effect. No hydrocephalus. No extra-axial fluid collection. Incidental note made of a partially empty sella. Midline structures intact and normal. Vascular: Major intracranial vascular flow voids are maintained. Skull and upper cervical spine: Craniocervical junction within normal limits. Visualized upper cervical spine unremarkable. Bone marrow signal intensity within normal limits. No scalp soft tissue abnormality. Sinuses/Orbits: Globes and orbital soft tissues within normal limits. Mild scattered mucosal thickening within the ethmoidal air cells. Paranasal sinuses are otherwise clear. No mastoid effusion. Inner ear structures normal. IMPRESSION: 1. No acute intracranial infarct or other process identified. 2. Multiple prominent dilated perivascular spaces clustered within the subcortical and deep white matter of the parietal regions bilaterally, most likely related to underlying cerebral atrophy, which is mildly advanced for age. These correspond with the previously noted hypodensities seen on prior CT. 3. Additional scattered cerebral white matter disease, overall mild for age. While these foci are nonspecific, the primary differential consideration would be for chronic microvascular ischemic changes. These would not be typical for underlying demyelinating disorder. Electronically Signed   By: Jeannine Boga M.D.   On: 04/07/2017 06:08    ____________________________________________   PROCEDURES  Procedure(s) performed: None  Procedures  Critical Care performed: No  ____________________________________________   INITIAL IMPRESSION / ASSESSMENT AND PLAN / ED COURSE  Pertinent labs & imaging results that were available during my care of the patient were reviewed by me and considered in my medical decision making (see chart for details).  This is a 46 year old male who comes into the hospital today with some numbness and some generalized weakness. I initially did a CT CTA looking for possible ischemic source of his symptoms. The patient has no slurred speech and no weakness but he does still have some numbness on his left side. The CT scan showed some patchy hypodensities in his bilateral parietal lobes I did send the patient as well for an MRI. His MRI showed a concern for some cerebral atrophy that was advanced for age but it was due to ischemic small vessel disease. The patient has not had any other complaints while in the emergency department. While he still says it feels a little bit different he does not have any weakness or any other stroke symptoms. I feel that the patient should follow up with neurology for further evaluation especially given these patchy hypodensities and concern for cortical volume loss and chronic small vessel ischemic disease. I discussed this with the patient. He will be discharged home to follow-up with his primary care physician as well as with neurology.  Clinical Course as of Apr 07 706  Tue Apr 07, 2017  0458 1. Normal CTA of the head and neck. 2. No acute intracranial process. 3. Patchy hypodensities within the subcortical/deep white matter of the bilateral parietal lobes. Findings are nonspecific. Primary differential considerations includes accelerated chronic small vessel ischemic disease. Possible demyelinating disease could also be  considered. 4.  Mildly advanced cerebral atrophy for age   CT Angio Head W or Wo Contrast [AW]    Clinical Course User Index [AW] Dahlia Client Eduard Roux, MD     ____________________________________________   FINAL CLINICAL IMPRESSION(S) / ED DIAGNOSES  Final diagnoses:  Weakness  Numbness  Paresthesias      NEW MEDICATIONS STARTED DURING THIS VISIT:  New Prescriptions   No medications on file     Note:  This document was prepared using Dragon voice recognition software and may include unintentional dictation errors.    Loney Hering, MD 04/07/17 [phone]

## 2017-04-07 NOTE — Discharge Instructions (Signed)
Please follow-up with your primary care physician as well as neurology.

## 2017-04-07 NOTE — ED Notes (Signed)
Report given to Rachel H. RN 

## 2017-04-09 ENCOUNTER — Ambulatory Visit (INDEPENDENT_AMBULATORY_CARE_PROVIDER_SITE_OTHER): Payer: BLUE CROSS/BLUE SHIELD | Admitting: Internal Medicine

## 2017-04-09 ENCOUNTER — Encounter: Payer: Self-pay | Admitting: Internal Medicine

## 2017-04-09 VITALS — BP 124/78 | HR 76 | Temp 98.1°F | Wt 163.0 lb

## 2017-04-09 DIAGNOSIS — R202 Paresthesia of skin: Secondary | ICD-10-CM

## 2017-04-09 LAB — VITAMIN B12: Vitamin B-12: 302 pg/mL (ref 211–911)

## 2017-04-09 LAB — VITAMIN D 25 HYDROXY (VIT D DEFICIENCY, FRACTURES): VITD: 20.83 ng/mL — AB (ref 30.00–100.00)

## 2017-04-09 LAB — TSH: TSH: 1.55 u[IU]/mL (ref 0.35–4.50)

## 2017-04-09 LAB — FOLATE: FOLATE: 10.7 ng/mL (ref >5.9)

## 2017-04-09 NOTE — Patient Instructions (Signed)
Paresthesia Paresthesia is a burning or prickling feeling. This feeling can happen in any part of the body. It often happens in the hands, arms, legs, or feet. Usually, it is not painful. In most cases, the feeling goes away in a short time and is not a sign of a serious problem. Follow these instructions at home:  Avoid drinking alcohol.  Try massage or needle therapy (acupuncture) to help with your problems.  Keep all follow-up visits as told by your doctor. This is important. Contact a doctor if:  You keep on having episodes of paresthesia.  Your burning or prickling feeling gets worse when you walk.  You have pain or cramps.  You feel dizzy.  You have a rash. Get help right away if:  You feel weak.  You have trouble walking or moving.  You have problems speaking, understanding, or seeing.  You feel confused.  You cannot control when you pee (urinate) or poop (bowel movement).  You lose feeling (numbness) after an injury.  You pass out (faint). This information is not intended to replace advice given to you by your health care provider. Make sure you discuss any questions you have with your health care provider. Document Released: 08/28/2008 Document Revised: 02/21/2016 Document Reviewed: 09/11/2014 [employer] Interactive Patient Education  2018 [employer]  

## 2017-04-09 NOTE — Progress Notes (Signed)
Subjective:    Patient ID: Kevin Graham, male    DOB: Sep 21, 1971, 46 y.o.   MRN: [id number]  HPI  Pt presents to the clinic today for ER followup. He went to the ER 7/10 with c/o numbness on the left side of his body, shortness of breath, weakness, and slurred speech. CTA of the head/neck was negative. MRI of the brain was negative. ECG was normal. Labs were unremarkable. His symptoms resolved spontaneously. He was discharged, advised to follow up with neurology and PCP. He saw Dr. Melrose Nakayama yesterday. He was started on Gabapentin. He reports he only has mild numbness in his left arm and left leg. He denies weakness, shortness of breath, dizziness, chest pain or near syncope.  Review of Systems   Past Medical History:  Diagnosis Date  . Environmental allergies   . History of stomach ulcers   . Seasonal allergies     Current Outpatient Prescriptions  Medication Sig Dispense Refill  . azelastine (OPTIVAR) 0.05 % ophthalmic solution As needed  0  . cetirizine (ZYRTEC) 10 MG tablet Take 10 mg by mouth daily as needed for allergies.    . famotidine (PEPCID) 10 MG tablet Take 10 mg by mouth 2 (two) times daily.    . fenofibrate 160 MG tablet Take 1 tablet (160 mg total) by mouth daily. 30 tablet 2  . fluticasone (FLONASE) 50 MCG/ACT nasal spray Place 1 spray into both nostrils 2 (two) times daily as needed.  0  . guaiFENesin-codeine 100-10 MG/5ML syrup 10 ml po qhs prn cough (Patient not taking: Reported on 01/16/2017) 120 mL 0  . pantoprazole (PROTONIX) 40 MG tablet take 1 tablet by mouth once daily 30 tablet 2  . simvastatin (ZOCOR) 40 MG tablet Take 1 tablet (40 mg total) by mouth at bedtime. 30 tablet 3   No current facility-administered medications for this visit.     No Known Allergies  Family History  Problem Relation Age of Onset  . Hyperlipidemia Mother     Social History   Social History  . Marital status: Single    Spouse name: N/A  . Number of children: N/A  . Years of  education: N/A   Occupational History  . Not on file.   Social History Main Topics  . Smoking status: Current Every Day Smoker    Packs/day: 0.25    Years: 38.00    Types: Cigarettes  . Smokeless tobacco: Never Used     Comment: 2 packs per week  . Alcohol use 1.8 oz/week    3 Cans of beer per week     Comment: 1-3 beers during the week, more on the weekends  . Drug use: No  . Sexual activity: Yes    Partners: Female   Other Topics Concern  . Not on file   Social History Narrative   Lives with girlfriend and her child in a one bedroom home.  Has 5 children.  Works at [employer].  Education: high school.     Constitutional: Denies fever, malaise, fatigue, headache or abrupt weight changes.  Musculoskeletal: Denies decrease in range of motion, difficulty with gait, muscle pain or joint pain and swelling.  Neurological: Pt reports numbness of left arm and left leg. Denies dizziness, difficulty with memory, difficulty with speech or problems with balance and coordination.    No other specific complaints in a complete review of systems (except as listed in HPI above).  Objective:   Physical Exam   BP 124/78  Pulse 76   Temp 98.1 F (36.7 C) (Oral)   Wt 163 lb (73.9 kg)   SpO2 98%   BMI 27.98 kg/m  Wt Readings from Last 3 Encounters:  04/09/17 163 lb (73.9 kg)  04/06/17 165 lb (74.8 kg)  01/16/17 166 lb 6.4 oz (75.5 kg)    General: Appears his stated age, in NAD. Cardiovascular: Normal rate and rhythm. S1,S2 noted.  No murmur, rubs or gallops noted.  Pulmonary/Chest: Normal effort and positive vesicular breath sounds. No respiratory distress. No wheezes, rales or ronchi noted.  Musculoskeletal: Strength 5/5 BUE/BLE. No difficulty with gait.  Neurological: Alert and oriented. Sensation 5/5 BUE/BLE.    BMET    Component Value Date/Time   NA 138 04/06/2017 2018   K 4.0 04/06/2017 2018   CL 104 04/06/2017 2018   CO2 25 04/06/2017 2018   GLUCOSE 105 (H)  04/06/2017 2018   BUN 18 04/06/2017 2018   CREATININE 0.77 04/06/2017 2018   CALCIUM 10.7 (H) 04/06/2017 2018   GFRNONAA >60 04/06/2017 2018   GFRAA >60 04/06/2017 2018    Lipid Panel     Component Value Date/Time   CHOL 278 (H) 01/16/2017 1502   TRIG (H) 01/16/2017 1502    504.0 Triglyceride is over 400; calculations on Lipids are invalid.   HDL 43.20 01/16/2017 1502   CHOLHDL 6 01/16/2017 1502   VLDL 39.6 10/02/2016 1129   LDLCALC 149 (H) 10/02/2016 1129    CBC    Component Value Date/Time   WBC 7.6 04/06/2017 2018   RBC 4.62 04/06/2017 2018   HGB 15.3 04/06/2017 2018   HCT 43.2 04/06/2017 2018   PLT 208 04/06/2017 2018   MCV 93.5 04/06/2017 2018   MCH 33.1 04/06/2017 2018   MCHC 35.4 04/06/2017 2018   RDW 12.6 04/06/2017 2018    Hgb A1C Lab Results  Component Value Date   HGBA1C 5.8 01/16/2017           Assessment & Plan:   ER Followup for Numbness, Weakness:  ER notes, labs and imaging reviewed Neurology notes reviewed Will check TSH, B12, Folate and Vit D today Continue Gabapentin  Return precautions discussed Webb Silversmith, NP

## 2017-04-10 MED ORDER — VITAMIN D (ERGOCALCIFEROL) 1.25 MG (50000 UNIT) PO CAPS: 50000.0000 [IU] | ORAL_CAPSULE | ORAL | 0 refills | Status: DC

## 2017-04-10 NOTE — Addendum Note (Signed)
Addended by: Lurlean Nanny on: 04/10/2017 04:57 PM   Modules accepted: Orders

## 2017-04-24 ENCOUNTER — Other Ambulatory Visit (INDEPENDENT_AMBULATORY_CARE_PROVIDER_SITE_OTHER): Payer: BLUE CROSS/BLUE SHIELD

## 2017-04-24 DIAGNOSIS — E782 Mixed hyperlipidemia: Secondary | ICD-10-CM | POA: Diagnosis not present

## 2017-04-24 LAB — COMPREHENSIVE METABOLIC PANEL
ALT: 31 U/L (ref 0–53)
AST: 21 U/L (ref 0–37)
Albumin: 4.4 g/dL (ref 3.5–5.2)
Alkaline Phosphatase: 42 U/L (ref 39–117)
BUN: 14 mg/dL (ref 6–23)
CALCIUM: 10.6 mg/dL — AB (ref 8.4–10.5)
CO2: 29 meq/L (ref 19–32)
Chloride: 103 mEq/L (ref 96–112)
Creatinine, Ser: 1 mg/dL (ref 0.40–1.50)
GFR: 85.53 mL/min (ref >60.00)
Glucose, Bld: 109 mg/dL — ABNORMAL HIGH (ref 70–99)
Potassium: 3.9 mEq/L (ref 3.5–5.1)
Sodium: 138 mEq/L (ref 135–145)
TOTAL PROTEIN: 7 g/dL (ref 6.0–8.3)
Total Bilirubin: 0.3 mg/dL (ref 0.2–1.2)

## 2017-04-24 LAB — LIPID PANEL
CHOLESTEROL: 183 mg/dL (ref 0–200)
HDL: 42.6 mg/dL (ref >39.00)
NonHDL: 140.09
TRIGLYCERIDES: 382 mg/dL — AB (ref 0.0–149.0)
Total CHOL/HDL Ratio: 4
VLDL: 76.4 mg/dL — ABNORMAL HIGH (ref 0.0–40.0)

## 2017-04-24 LAB — LDL CHOLESTEROL, DIRECT: LDL DIRECT: 108 mg/dL

## 2017-05-09 ENCOUNTER — Other Ambulatory Visit: Payer: Self-pay | Admitting: Internal Medicine

## 2017-05-09 DIAGNOSIS — E782 Mixed hyperlipidemia: Secondary | ICD-10-CM

## 2017-06-08 ENCOUNTER — Other Ambulatory Visit: Payer: Self-pay | Admitting: Internal Medicine

## 2017-06-08 DIAGNOSIS — E782 Mixed hyperlipidemia: Secondary | ICD-10-CM

## 2017-08-07 ENCOUNTER — Other Ambulatory Visit: Payer: Self-pay

## 2017-08-07 MED ORDER — PANTOPRAZOLE SODIUM 40 MG PO TBEC: 40.0000 mg | DELAYED_RELEASE_TABLET | Freq: Every day | ORAL | 4 refills | Status: DC

## 2017-08-18 ENCOUNTER — Other Ambulatory Visit: Payer: Self-pay | Admitting: Internal Medicine

## 2017-08-18 DIAGNOSIS — E782 Mixed hyperlipidemia: Secondary | ICD-10-CM

## 2017-08-26 ENCOUNTER — Ambulatory Visit: Payer: BLUE CROSS/BLUE SHIELD | Admitting: Family Medicine

## 2017-08-26 ENCOUNTER — Encounter: Payer: Self-pay | Admitting: Family Medicine

## 2017-08-26 VITALS — BP 122/78 | HR 72 | Temp 97.9°F | Wt 166.0 lb

## 2017-08-26 DIAGNOSIS — J069 Acute upper respiratory infection, unspecified: Secondary | ICD-10-CM | POA: Diagnosis not present

## 2017-08-26 MED ORDER — AMOXICILLIN 875 MG PO TABS: 875.0000 mg | ORAL_TABLET | Freq: Two times a day (BID) | ORAL | 0 refills | Status: DC

## 2017-08-26 NOTE — Progress Notes (Signed)
Subjective:    Patient ID: Kevin Graham, male    DOB: 31-Jul-1971, 46 y.o.   MRN: [id number]  HPI This is a 46 yo male who presents today with sinus pressure and drainage, nasal congestion. Occasional cough with sputum. Occasional chills, hasn't checked temperature. No ear pain. No SOB. Has been taking Delsym, Mucinex and antibiotic once a day for a couple of days (amoxicillin 500 mg) from [place]. Has been using flonase for a couple of days. Has not been taking Zyrtec.    Past Medical History:  Diagnosis Date  . Environmental allergies   . History of stomach ulcers   . Seasonal allergies    Past Surgical History:  Procedure Laterality Date  . NO PAST SURGERIES     Family History  Problem Relation Age of Onset  . Hyperlipidemia Mother    Social History   Tobacco Use  . Smoking status: Current Every Day Smoker    Packs/day: 0.25    Years: 38.00    Pack years: 9.50    Types: Cigarettes  . Smokeless tobacco: Never Used  . Tobacco comment: 2 packs per week  Substance Use Topics  . Alcohol use: Yes    Alcohol/week: 1.8 oz    Types: 3 Cans of beer per week    Comment: 1-3 beers during the week, more on the weekends  . Drug use: No      Review of Systems Per HPI    Objective:   Physical Exam  Constitutional: He appears well-developed and well-nourished. No distress.  HENT:  Head: Normocephalic and atraumatic.  Right Ear: Tympanic membrane, external ear and ear canal normal.  Left Ear: Tympanic membrane, external ear and ear canal normal.  Nose: Mucosal edema and rhinorrhea present. Right sinus exhibits no maxillary sinus tenderness and no frontal sinus tenderness.  Mouth/Throat: Uvula is midline. No oropharyngeal exudate, posterior oropharyngeal edema or posterior oropharyngeal erythema.  Sounds congested  Eyes: Conjunctivae are normal.  Neck: Normal range of motion. Neck supple.  Cardiovascular: Normal rate, regular rhythm and normal heart sounds.  Pulmonary/Chest:  Effort normal and breath sounds normal.  Lymphadenopathy:    He has no cervical adenopathy.  Skin: He is not diaphoretic.  Vitals reviewed.    BP 122/78 (BP Location: Right Arm, Patient Position: Sitting, Cuff Size: Normal)   Pulse 72   Temp 97.9 F (36.6 C) (Oral)   Wt 166 lb (75.3 kg)   SpO2 97%   BMI 28.49 kg/m  Wt Readings from Last 3 Encounters:  08/26/17 166 lb (75.3 kg)  04/09/17 163 lb (73.9 kg)  04/06/17 165 lb (74.8 kg)        Assessment & Plan:  1. Upper respiratory tract infection, unspecified type - Patient Instructions  For nasal congestion you can use Afrin nasal spray for 3 days max, Sudafed, saline nasal spray (generic is fine for all). For cough you can try Delsym. Drink enough fluids to make your urine light yellow. Take your Zyrtec daily.  For fever/chill/muscle aches you can take over the counter acetaminophen or ibuprofen.  Please come back in if you are not better in 5-7 days or if you develop wheezing, shortness of breath or persistent vomiting.    - will go ahead and give him a full course of appropriately dosed amoxicillin - amoxicillin (AMOXIL) 875 MG tablet; Take 1 tablet (875 mg total) by mouth 2 (two) times daily.  Dispense: 14 tablet; Refill: 0   Clarene Reamer, FNP-BC  [facility]  at [facility], [place]  08/26/2017 9:40 AM

## 2017-08-26 NOTE — Patient Instructions (Signed)
For nasal congestion you can use Afrin nasal spray for 3 days max, Sudafed, saline nasal spray (generic is fine for all). For cough you can try Delsym. Drink enough fluids to make your urine light yellow. Take your Zyrtec daily.  For fever/chill/muscle aches you can take over the counter acetaminophen or ibuprofen.  Please come back in if you are not better in 5-7 days or if you develop wheezing, shortness of breath or persistent vomiting.

## 2017-10-28 ENCOUNTER — Other Ambulatory Visit: Payer: Self-pay | Admitting: Internal Medicine

## 2017-10-28 DIAGNOSIS — E782 Mixed hyperlipidemia: Secondary | ICD-10-CM

## 2018-01-14 ENCOUNTER — Other Ambulatory Visit: Payer: Self-pay | Admitting: Internal Medicine

## 2018-01-14 DIAGNOSIS — E782 Mixed hyperlipidemia: Secondary | ICD-10-CM

## 2018-01-19 ENCOUNTER — Ambulatory Visit (INDEPENDENT_AMBULATORY_CARE_PROVIDER_SITE_OTHER): Payer: BLUE CROSS/BLUE SHIELD | Admitting: Internal Medicine

## 2018-01-19 ENCOUNTER — Encounter: Payer: Self-pay | Admitting: Internal Medicine

## 2018-01-19 VITALS — BP 122/78 | HR 67 | Temp 97.4°F | Ht 62.0 in | Wt 161.0 lb

## 2018-01-19 DIAGNOSIS — F1010 Alcohol abuse, uncomplicated: Secondary | ICD-10-CM

## 2018-01-19 DIAGNOSIS — E782 Mixed hyperlipidemia: Secondary | ICD-10-CM

## 2018-01-19 DIAGNOSIS — Z0000 Encounter for general adult medical examination without abnormal findings: Secondary | ICD-10-CM

## 2018-01-19 DIAGNOSIS — M79601 Pain in right arm: Secondary | ICD-10-CM

## 2018-01-19 DIAGNOSIS — R202 Paresthesia of skin: Secondary | ICD-10-CM | POA: Diagnosis not present

## 2018-01-19 DIAGNOSIS — E559 Vitamin D deficiency, unspecified: Secondary | ICD-10-CM

## 2018-01-19 DIAGNOSIS — K219 Gastro-esophageal reflux disease without esophagitis: Secondary | ICD-10-CM

## 2018-01-19 DIAGNOSIS — M79602 Pain in left arm: Secondary | ICD-10-CM

## 2018-01-19 LAB — LIPID PANEL
Cholesterol: 181 mg/dL (ref 0–200)
HDL: 46.2 mg/dL (ref >39.00)
NONHDL: 134.39
Total CHOL/HDL Ratio: 4
Triglycerides: 254 mg/dL — ABNORMAL HIGH (ref 0.0–149.0)
VLDL: 50.8 mg/dL — ABNORMAL HIGH (ref 0.0–40.0)

## 2018-01-19 LAB — COMPREHENSIVE METABOLIC PANEL
ALT: 21 U/L (ref 0–53)
AST: 17 U/L (ref 0–37)
Albumin: 4.4 g/dL (ref 3.5–5.2)
Alkaline Phosphatase: 46 U/L (ref 39–117)
BUN: 16 mg/dL (ref 6–23)
CO2: 30 meq/L (ref 19–32)
CREATININE: 0.94 mg/dL (ref 0.40–1.50)
Calcium: 10 mg/dL (ref 8.4–10.5)
Chloride: 104 mEq/L (ref 96–112)
GFR: 91.56 mL/min (ref >60.00)
GLUCOSE: 97 mg/dL (ref 70–99)
Potassium: 4.4 mEq/L (ref 3.5–5.1)
SODIUM: 137 meq/L (ref 135–145)
Total Bilirubin: 0.4 mg/dL (ref 0.2–1.2)
Total Protein: 7 g/dL (ref 6.0–8.3)

## 2018-01-19 LAB — CBC
HCT: 43.8 % (ref 39.0–52.0)
Hemoglobin: 14.9 g/dL (ref 13.0–17.0)
MCHC: 34.1 g/dL (ref 30.0–36.0)
MCV: 94.3 fl (ref 78.0–100.0)
Platelets: 186 10*3/uL (ref 150.0–400.0)
RBC: 4.65 Mil/uL (ref 4.22–5.81)
RDW: 13 % (ref 11.5–15.5)
WBC: 4.5 10*3/uL (ref 4.0–10.5)

## 2018-01-19 LAB — LDL CHOLESTEROL, DIRECT: LDL DIRECT: 114 mg/dL

## 2018-01-19 LAB — VITAMIN D 25 HYDROXY (VIT D DEFICIENCY, FRACTURES): VITD: 19.97 ng/mL — AB (ref 30.00–100.00)

## 2018-01-19 NOTE — Patient Instructions (Signed)

## 2018-01-19 NOTE — Progress Notes (Signed)
Subjective:    Patient ID: Kevin Graham, male    DOB: [birth year], 47 y.o.   MRN: [id number]  HPI  Pt presents to the clinic today for his annual exam. He is also due to follow up chronic conditions.  HLD: His last LDL was 108, triglycerides 382, 03/2017. He denies myalgias on Simvastatin. He is taking Fenofibrate as well. He does not consume a low fat diet.  GERD: Intermittent. He takes Pantoprazole and Famotidine as needed with good relief.  Paresthesia of BUE/BLE: His B12 levels were normal. His Vit D was supplemented with Ergocalciferol. He is due to have Vit D repeated today. He was also treated with Neurontin which he reports has provided great relief, but he reports he is no longer taking this.  Flu: 06/2017 Tetanus: 05/2016 Vision Screening: as needed Dentist: biannually  Diet: He does eat meat. He consumes fruits and veggies daily. He does eat fried food. He drinks mostly water, beer and liquor. Exercise: None  Review of Systems      Past Medical History:  Diagnosis Date  . Environmental allergies   . History of stomach ulcers   . Seasonal allergies     Current Outpatient Medications  Medication Sig Dispense Refill  . azelastine (OPTIVAR) 0.05 % ophthalmic solution As needed  0  . cetirizine (ZYRTEC) 10 MG tablet Take 10 mg by mouth daily as needed for allergies.    . famotidine (PEPCID) 10 MG tablet Take 10 mg by mouth 2 (two) times daily.    . fenofibrate 160 MG tablet TAKE 1 TABLET BY MOUTH ONCE DAILY 30 tablet 0  . fluticasone (FLONASE) 50 MCG/ACT nasal spray Place 1 spray into both nostrils 2 (two) times daily as needed.  0  . gabapentin (NEURONTIN) 100 MG capsule Take 100 mg twice a day for one week, then increase to 200 mg(2 tablets) twice a day and continue    . pantoprazole (PROTONIX) 40 MG tablet Take 1 tablet (40 mg total) daily by mouth. 30 tablet 4  . simvastatin (ZOCOR) 40 MG tablet TAKE 1 TABLET BY MOUTH AT BEDTIME 30 tablet 0   No current  facility-administered medications for this visit.     No Known Allergies  Family History  Problem Relation Age of Onset  . Hyperlipidemia Mother     Social History   Socioeconomic History  . Marital status: Single    Spouse name: Not on file  . Number of children: Not on file  . Years of education: Not on file  . Highest education level: Not on file  Occupational History  . Not on file  Social Needs  . Financial resource strain: Not on file  . Food insecurity:    Worry: Not on file    Inability: Not on file  . Transportation needs:    Medical: Not on file    Non-medical: Not on file  Tobacco Use  . Smoking status: Current Every Day Smoker    Packs/day: 0.25    Years: 38.00    Pack years: 9.50    Types: Cigarettes  . Smokeless tobacco: Never Used  . Tobacco comment: 2 packs per week  Substance and Sexual Activity  . Alcohol use: Yes    Alcohol/week: 1.8 oz    Types: 3 Cans of beer per week    Comment: 1-3 beers during the week, more on the weekends  . Drug use: No  . Sexual activity: Yes    Partners: Female  Lifestyle  .  Physical activity:    Days per week: Not on file    Minutes per session: Not on file  . Stress: Not on file  Relationships  . Social connections:    Talks on phone: Not on file    Gets together: Not on file    Attends religious service: Not on file    Active member of club or organization: Not on file    Attends meetings of clubs or organizations: Not on file    Relationship status: Not on file  . Intimate partner violence:    Fear of current or ex partner: Not on file    Emotionally abused: Not on file    Physically abused: Not on file    Forced sexual activity: Not on file  Other Topics Concern  . Not on file  Social History Narrative   Lives with girlfriend and her child in a one bedroom home.  Has 5 children.  Works at [employer].  Education: high school.     Constitutional: Denies fever, malaise, fatigue, headache or abrupt  weight changes.  HEENT: Denies eye pain, eye redness, ear pain, ringing in the ears, wax buildup, runny nose, nasal congestion, bloody nose, or sore throat. Respiratory: Denies difficulty breathing, shortness of breath, cough or sputum production.   Cardiovascular: Denies chest pain, chest tightness, palpitations or swelling in the hands or feet.  Gastrointestinal: Pt reports intermittent reflux. Denies abdominal pain, bloating, constipation, diarrhea or blood in the stool.  GU: Denies urgency, frequency, pain with urination, burning sensation, blood in urine, odor or discharge. Musculoskeletal: Denies decrease in range of motion, difficulty with gait, muscle pain or joint pain and swelling.  Skin: Denies redness, rashes, lesions or ulcercations.  Neurological: Denies dizziness, difficulty with memory, difficulty with speech or problems with balance and coordination.  Psych: Denies anxiety, depression, SI/HI.  No other specific complaints in a complete review of systems (except as listed in HPI above).  Objective:   Physical Exam   BP 122/78 (BP Location: Right Arm, Patient Position: Sitting, Cuff Size: Normal)   Pulse 67   Temp (!) 97.4 F (36.3 C) (Oral)   Ht 5\' 2"  (1.575 m)   Wt 161 lb (73 kg)   SpO2 99%   BMI 29.45 kg/m  Wt Readings from Last 3 Encounters:  01/19/18 161 lb (73 kg)  08/26/17 166 lb (75.3 kg)  04/09/17 163 lb (73.9 kg)    General: Appears his stated age, well developed, well nourished in NAD. Skin: Warm, dry and intact.  HEENT: Head: normal shape and size; Eyes: sclera white, no icterus, conjunctiva pink, PERRLA and EOMs intact; Ears: Tm's gray and intact, normal light reflex; Throat/Mouth: Teeth present, mucosa pink and moist, no exudate, lesions or ulcerations noted.  Neck:  Neck supple, trachea midline. No masses, lumps or thyromegaly present.  Cardiovascular: Normal rate and rhythm. S1,S2 noted.  No murmur, rubs or gallops noted. No JVD or BLE edema.    Pulmonary/Chest: Normal effort and positive vesicular breath sounds. No respiratory distress. No wheezes, rales or ronchi noted.  Abdomen: Soft and nontender. Normal bowel sounds. No distention or masses noted. Liver, spleen and kidneys non palpable. Musculoskeletal: Strength 5/5 BUE/BLE. No difficulty with gait.  Neurological: Alert and oriented. Cranial nerves II-XII grossly intact. Coordination normal.  Psychiatric: Mood and affect normal. Behavior is normal. Judgment and thought content normal.     BMET    Component Value Date/Time   NA 138 04/24/2017 0815   K 3.9  04/24/2017 0815   CL 103 04/24/2017 0815   CO2 29 04/24/2017 0815   GLUCOSE 109 (H) 04/24/2017 0815   BUN 14 04/24/2017 0815   CREATININE 1.00 04/24/2017 0815   CALCIUM 10.6 (H) 04/24/2017 0815   GFRNONAA >60 04/06/2017 2018   GFRAA >60 04/06/2017 2018    Lipid Panel     Component Value Date/Time   CHOL 183 04/24/2017 0815   TRIG 382.0 (H) 04/24/2017 0815   HDL 42.60 04/24/2017 0815   CHOLHDL 4 04/24/2017 0815   VLDL 76.4 (H) 04/24/2017 0815   LDLCALC 149 (H) 10/02/2016 1129    CBC    Component Value Date/Time   WBC 7.6 04/06/2017 2018   RBC 4.62 04/06/2017 2018   HGB 15.3 04/06/2017 2018   HCT 43.2 04/06/2017 2018   PLT 208 04/06/2017 2018   MCV 93.5 04/06/2017 2018   MCH 33.1 04/06/2017 2018   MCHC 35.4 04/06/2017 2018   RDW 12.6 04/06/2017 2018    Hgb A1C Lab Results  Component Value Date   HGBA1C 5.8 01/16/2017            Assessment & Plan:   Preventative Health Maintenance:  Encouraged him to get a flu shot in the fall Tetanus UTD Encouraged him to consume a balanced diet and exercise regimen Advised him to see an eye doctor and dentist annualy We had a lengthy discussion about his alcohol intake but he does not think he has a problem Will check CBC, CMET, Lipid and Vit D today  RTC in 1 year, sooner if needed Webb Silversmith, NP

## 2018-01-20 ENCOUNTER — Other Ambulatory Visit: Payer: Self-pay | Admitting: Internal Medicine

## 2018-01-20 DIAGNOSIS — E559 Vitamin D deficiency, unspecified: Secondary | ICD-10-CM

## 2018-01-20 DIAGNOSIS — F1010 Alcohol abuse, uncomplicated: Secondary | ICD-10-CM | POA: Insufficient documentation

## 2018-01-20 DIAGNOSIS — M79601 Pain in right arm: Secondary | ICD-10-CM | POA: Insufficient documentation

## 2018-01-20 DIAGNOSIS — R202 Paresthesia of skin: Secondary | ICD-10-CM

## 2018-01-20 DIAGNOSIS — M79602 Pain in left arm: Secondary | ICD-10-CM

## 2018-01-20 MED ORDER — VITAMIN D (ERGOCALCIFEROL) 1.25 MG (50000 UNIT) PO CAPS: 50000.0000 [IU] | ORAL_CAPSULE | ORAL | 0 refills | Status: DC

## 2018-01-20 NOTE — Assessment & Plan Note (Signed)
Discussed foods to avoid Encouraged weight loss Advised him to stop the Pantoprazole, continue Famotidine prn CBC and CMET today

## 2018-01-20 NOTE — Assessment & Plan Note (Signed)
Discussed cutting back on this He does not think he has a problem

## 2018-01-20 NOTE — Assessment & Plan Note (Signed)
CMET and Lipid profile today Encouraged him to consume a low fat diet Continue Simvastatin and Fenofibrate for now

## 2018-01-20 NOTE — Assessment & Plan Note (Signed)
Will recheck Vit D today If he does not take Neurotin, will d/c

## 2018-02-12 ENCOUNTER — Other Ambulatory Visit: Payer: Self-pay | Admitting: Internal Medicine

## 2018-02-12 DIAGNOSIS — E782 Mixed hyperlipidemia: Secondary | ICD-10-CM

## 2018-03-10 ENCOUNTER — Other Ambulatory Visit: Payer: Self-pay | Admitting: Internal Medicine

## 2018-03-10 DIAGNOSIS — E782 Mixed hyperlipidemia: Secondary | ICD-10-CM

## 2018-04-07 ENCOUNTER — Telehealth: Payer: Self-pay

## 2018-04-07 DIAGNOSIS — E781 Pure hyperglyceridemia: Secondary | ICD-10-CM

## 2018-04-07 NOTE — Telephone Encounter (Signed)
Patient is coming next week on 04/14/18 for Vit D level re check and he wants to also have Lipid level re checked at that time too. He likes to have this done every 3 months or so. He knows that insurance may not pay but once a year for this. Please let patient know if this is not possible. CB: [phone] (M) Thank Edrick Kins, RMA

## 2018-04-08 NOTE — Telephone Encounter (Signed)
Lipid panel ordered.

## 2018-04-08 NOTE — Addendum Note (Signed)
Addended by: Jearld Fenton on: 04/08/2018 08:09 AM   Modules accepted: Orders

## 2018-04-09 ENCOUNTER — Other Ambulatory Visit: Payer: Self-pay | Admitting: Internal Medicine

## 2018-04-09 DIAGNOSIS — E782 Mixed hyperlipidemia: Secondary | ICD-10-CM

## 2018-04-14 ENCOUNTER — Other Ambulatory Visit (INDEPENDENT_AMBULATORY_CARE_PROVIDER_SITE_OTHER): Payer: BLUE CROSS/BLUE SHIELD

## 2018-04-14 DIAGNOSIS — E559 Vitamin D deficiency, unspecified: Secondary | ICD-10-CM | POA: Diagnosis not present

## 2018-04-14 DIAGNOSIS — E781 Pure hyperglyceridemia: Secondary | ICD-10-CM | POA: Diagnosis not present

## 2018-04-14 LAB — LDL CHOLESTEROL, DIRECT: LDL DIRECT: 122 mg/dL

## 2018-04-14 LAB — LIPID PANEL
Cholesterol: 212 mg/dL — ABNORMAL HIGH (ref 0–200)
HDL: 44.5 mg/dL (ref >39.00)
NonHDL: 167.17
Total CHOL/HDL Ratio: 5
Triglycerides: 350 mg/dL — ABNORMAL HIGH (ref 0.0–149.0)
VLDL: 70 mg/dL — ABNORMAL HIGH (ref 0.0–40.0)

## 2018-04-14 LAB — VITAMIN D 25 HYDROXY (VIT D DEFICIENCY, FRACTURES): VITD: 26.52 ng/mL — AB (ref 30.00–100.00)

## 2018-05-06 ENCOUNTER — Other Ambulatory Visit: Payer: Self-pay | Admitting: Internal Medicine

## 2018-07-08 ENCOUNTER — Other Ambulatory Visit: Payer: Self-pay | Admitting: Internal Medicine

## 2018-07-08 DIAGNOSIS — E782 Mixed hyperlipidemia: Secondary | ICD-10-CM

## 2018-08-30 ENCOUNTER — Encounter: Payer: Self-pay | Admitting: Internal Medicine

## 2018-08-30 ENCOUNTER — Ambulatory Visit (INDEPENDENT_AMBULATORY_CARE_PROVIDER_SITE_OTHER): Payer: BLUE CROSS/BLUE SHIELD

## 2018-08-30 DIAGNOSIS — E782 Mixed hyperlipidemia: Secondary | ICD-10-CM | POA: Diagnosis not present

## 2018-08-30 LAB — LIPID PANEL
CHOL/HDL RATIO: 5
CHOLESTEROL: 205 mg/dL — AB (ref 0–200)
HDL: 40.7 mg/dL (ref >39.00)
Triglycerides: 444 mg/dL — ABNORMAL HIGH (ref 0.0–149.0)

## 2018-08-30 LAB — LDL CHOLESTEROL, DIRECT: LDL DIRECT: 117 mg/dL

## 2018-08-30 NOTE — Progress Notes (Deleted)
   Subjective:    Patient ID: Kevin Graham, male    DOB: 20-Jul-1971, 47 y.o.   MRN: [id number]  HPI   Review of Systems    Objective:   Physical Exam        Assessment & Plan:

## 2018-09-01 MED ORDER — FENOFIBRATE 160 MG PO TABS: 160.0000 mg | ORAL_TABLET | Freq: Every day | ORAL | 2 refills | Status: DC

## 2018-09-01 MED ORDER — SIMVASTATIN 40 MG PO TABS: 40.0000 mg | ORAL_TABLET | Freq: Every day | ORAL | 2 refills | Status: DC

## 2018-09-01 NOTE — Addendum Note (Signed)
Addended by: Lurlean Nanny on: 09/01/2018 11:51 AM   Modules accepted: Orders

## 2018-09-23 ENCOUNTER — Encounter: Payer: Self-pay | Admitting: *Deleted

## 2018-10-14 ENCOUNTER — Ambulatory Visit: Payer: PRIVATE HEALTH INSURANCE | Admitting: Allergy

## 2018-10-14 ENCOUNTER — Telehealth: Payer: Self-pay | Admitting: *Deleted

## 2018-10-14 ENCOUNTER — Encounter: Payer: Self-pay | Admitting: Allergy

## 2018-10-14 VITALS — BP 124/84 | HR 74 | Temp 97.5°F | Resp 18 | Ht 61.5 in | Wt 158.0 lb

## 2018-10-14 DIAGNOSIS — F17210 Nicotine dependence, cigarettes, uncomplicated: Secondary | ICD-10-CM | POA: Diagnosis not present

## 2018-10-14 DIAGNOSIS — R059 Cough, unspecified: Secondary | ICD-10-CM

## 2018-10-14 DIAGNOSIS — H1013 Acute atopic conjunctivitis, bilateral: Secondary | ICD-10-CM | POA: Insufficient documentation

## 2018-10-14 DIAGNOSIS — J3089 Other allergic rhinitis: Secondary | ICD-10-CM

## 2018-10-14 DIAGNOSIS — R05 Cough: Secondary | ICD-10-CM

## 2018-10-14 MED ORDER — AZELASTINE HCL 0.05 % OP SOLN: 1.0000 [drp] | Freq: Every day | OPHTHALMIC | 5 refills | Status: DC | PRN

## 2018-10-14 MED ORDER — FLUTICASONE PROPIONATE 50 MCG/ACT NA SUSP: 1.0000 | Freq: Two times a day (BID) | NASAL | 5 refills | Status: DC | PRN

## 2018-10-14 NOTE — Progress Notes (Signed)
New Patient Note  RE: Kevin Graham MRN: [id number] DOB: [birth year] Date of Office Visit: 10/14/2018  Referring provider: Jearld Fenton, NP Primary care provider: Jearld Fenton, NP  Chief Complaint: Nasal Congestion; Burning Eyes; and Establish Care  History of Present Illness: I had the pleasure of seeing [name] for initial evaluation at the [facility] on 10/14/2018. He is a 48 y.o. male, who is self-referred here to transfer care with a new allergist due to change in his insurance.   Allergic rhinitis: He reports symptoms of itchy eyes, rhinorrhea, nasal congestion, coughing, sneezing. Symptoms have been going on for 5+ years. The symptoms are present all year around with worsening in spring. Other triggers include exposure to pollen. Anosmia: no. Headache: very rarely. He has used zyrtec, azelastine eye drops prn, Flonase with good improvement in symptoms. Sinus infections: none. Previous work up includes: a few month ago had skin testing which still showed some positives - in process of obtaining records. Patient was receiving allergy injections once a week for about 5 years with good benefit and no systemic reactions at [facility]. Previous ENT evaluation: no.  Assessment and Plan: Nickoli is a 48 y.o. male with: Other allergic rhinitis Perennial rhino conjunctivitis symptoms for the past 5+ years with worsening in the spring. Patient was receiving allergy injections weekly for about 5 years with good benefit at [facility] allergy. He needs to establish care with new allergist due to change in his insurance. Last skin testing was a few months ago - in process of obtaining records.  Will review medical records from your previous allergist and will order additional testing if necessary. Otherwise will create serum from last testing results.   Allergy immunotherapy consent form is signed.  May use over the counter antihistamines such as Zyrtec (cetirizine),  Claritin (loratadine), Allegra (fexofenadine), or Xyzal (levocetirizine) daily as needed.  May use Flonase 1-2 sprays daily as needed for stuffy nose.  May use azelastine eye drops 1 drop in each eye twice a day as needed for itchy eyes.   Allergic conjunctivitis of both eyes See assessment and plan as above.  Coughing Complaining of some coughing the last few months. No other symptoms. He does smoke and has been doing so for over 30 years. Most likely due to post nasal drip.   Today's spirometry showed: possible restrictive disease.  Monitor symptoms. If persistent will do further work up at that time.  Decrease smoking.   Return in about 4 months (around 02/12/2019).  Meds ordered this encounter  Medications  . fluticasone (FLONASE) 50 MCG/ACT nasal spray    Sig: Place 1 spray into both nostrils 2 (two) times daily as needed.    Dispense:  16 g    Refill:  5  . azelastine (OPTIVAR) 0.05 % ophthalmic solution    Sig: Place 1 drop into both eyes daily as needed. As needed    Dispense:  6 mL    Refill:  5   Other allergy screening: Asthma: no Rhino conjunctivitis: yes Food allergy: no Medication allergy: no Hymenoptera allergy: no Urticaria: no Eczema:no History of recurrent infections suggestive of immunodeficency: no  Diagnostics: Spirometry:  Tracings reviewed. His effort: It was hard to get consistent efforts and there is a question as to whether this reflects a maximal maneuver. FVC: 2.29L FEV1: 2.22L, 85% predicted FEV1/FVC ratio: 97% Interpretation: Spirometry consistent with possible restrictive disease.  Please see scanned spirometry results for details.  Skin Testing:  None. Patient just had allergy testing done - in process of obtaining records.  Past Medical History: Patient Active Problem List   Diagnosis Date Noted  . Coughing 10/14/2018  . Other allergic rhinitis 10/14/2018  . Allergic conjunctivitis of both eyes 10/14/2018  . Light tobacco  smoker <10 cigarettes per day 10/14/2018  . Alcohol abuse 01/20/2018  . Paresthesia and pain of both upper extremities 01/20/2018  . Elevated cholesterol with high triglycerides 02/04/2016  . Gastroesophageal reflux disease without esophagitis 01/07/2016  . Environmental allergies 01/01/2015   Past Medical History:  Diagnosis Date  . Environmental allergies   . History of stomach ulcers   . Seasonal allergies    Past Surgical History: Past Surgical History:  Procedure Laterality Date  . NO PAST SURGERIES     Medication List:  Current Outpatient Medications  Medication Sig Dispense Refill  . azelastine (OPTIVAR) 0.05 % ophthalmic solution Place 1 drop into both eyes daily as needed. As needed 6 mL 5  . cetirizine (ZYRTEC) 10 MG tablet Take 10 mg by mouth daily as needed for allergies.    Marland Kitchen EPINEPHrine (EPIPEN 2-PAK) 0.3 mg/0.3 mL IJ SOAJ injection Inject 0.3 mg into the muscle once.    . famotidine (PEPCID) 10 MG tablet Take 10 mg by mouth 2 (two) times daily.    . fenofibrate 160 MG tablet Take 1 tablet (160 mg total) by mouth daily. 30 tablet 2  . fluticasone (FLONASE) 50 MCG/ACT nasal spray Place 1 spray into both nostrils 2 (two) times daily as needed. 16 g 5  . gabapentin (NEURONTIN) 100 MG capsule Take 100 mg twice a day for one week, then increase to 200 mg(2 tablets) twice a day and continue    . pantoprazole (PROTONIX) 40 MG tablet Take 1 tablet (40 mg total) daily by mouth. 30 tablet 4  . simvastatin (ZOCOR) 40 MG tablet Take 1 tablet (40 mg total) by mouth at bedtime. 30 tablet 2   No current facility-administered medications for this visit.    Allergies: No Known Allergies Social History: Social History   Socioeconomic History  . Marital status: Single    Spouse name: Not on file  . Number of children: Not on file  . Years of education: Not on file  . Highest education level: Not on file  Occupational History  . Not on file  Social Needs  . Financial resource  strain: Not on file  . Food insecurity:    Worry: Not on file    Inability: Not on file  . Transportation needs:    Medical: Not on file    Non-medical: Not on file  Tobacco Use  . Smoking status: Current Every Day Smoker    Packs/day: 0.25    Years: 38.00    Pack years: 9.50    Types: Cigarettes  . Smokeless tobacco: Never Used  . Tobacco comment: 2 packs per week  Substance and Sexual Activity  . Alcohol use: Yes    Alcohol/week: 3.0 standard drinks    Types: 3 Cans of beer per week    Comment: 1-3 beers during the week, more on the weekends  . Drug use: No  . Sexual activity: Yes    Partners: Female  Lifestyle  . Physical activity:    Days per week: Not on file    Minutes per session: Not on file  . Stress: Not on file  Relationships  . Social connections:    Talks on phone: Not on file  Gets together: Not on file    Attends religious service: Not on file    Active member of club or organization: Not on file    Attends meetings of clubs or organizations: Not on file    Relationship status: Not on file  Other Topics Concern  . Not on file  Social History Narrative   Lives with girlfriend and her child in a one bedroom home.  Has 5 children.  Works at [employer].  Education: high school.   Lives in a 48 year old home. Smoking: 1/4 pack per day x 30 yrs Occupation: Conservator, museum/gallery HistoryFreight forwarder in the house: no Charity fundraiser in the family room: no Carpet in the bedroom: no Heating: electric Cooling: central Pet: no  Family History: Family History  Problem Relation Age of Onset  . Hyperlipidemia Mother    Problem                               Relation Asthma                                   No  Eczema                                No  Food allergy                          Daughter  Allergic rhino conjunctivitis     No   Review of Systems  Constitutional: Negative for appetite change, chills, fever and unexpected weight change.  HENT:  Positive for congestion and rhinorrhea.   Eyes: Positive for itching.  Respiratory: Positive for cough. Negative for chest tightness, shortness of breath and wheezing.   Cardiovascular: Negative for chest pain.  Gastrointestinal: Negative for abdominal pain.  Genitourinary: Negative for difficulty urinating.  Skin: Negative for rash.  Allergic/Immunologic: Positive for environmental allergies. Negative for food allergies.  Neurological: Negative for headaches.   Objective: BP 124/84 (BP Location: Right Arm, Patient Position: Sitting, Cuff Size: Normal)   Pulse 74   Temp (!) 97.5 F (36.4 C) (Oral)   Resp 18   Ht 5' 1.5" (1.562 m)   Wt 158 lb (71.7 kg)   SpO2 98%   BMI 29.37 kg/m  Body mass index is 29.37 kg/m. Physical Exam  Constitutional: He is oriented to person, place, and time. He appears well-developed and well-nourished.  HENT:  Head: Normocephalic and atraumatic.  Right Ear: External ear normal.  Left Ear: External ear normal.  Nose: Nose normal.  Mouth/Throat: Oropharynx is clear and moist.  Eyes: Conjunctivae and EOM are normal.  Neck: Neck supple.  Cardiovascular: Normal rate, regular rhythm and normal heart sounds. Exam reveals no gallop and no friction rub.  No murmur heard. Pulmonary/Chest: Effort normal and breath sounds normal. He has no wheezes. He has no rales.  Abdominal: Soft.  Lymphadenopathy:    He has no cervical adenopathy.  Neurological: He is alert and oriented to person, place, and time.  Skin: Skin is warm. No rash noted.  Psychiatric: He has a normal mood and affect. His behavior is normal.  Nursing note and vitals reviewed.  The plan was reviewed with the patient/family, and all questions/concerned were addressed.  It was my pleasure to  see Kevin Graham today and participate in his care. Please feel free to contact me with any questions or concerns.  Sincerely,  Rexene Alberts, DO Allergy & Immunology  [facility] office: [phone] [facility] office: [phone]

## 2018-10-14 NOTE — Assessment & Plan Note (Signed)
Complaining of some coughing the last few months. No other symptoms. He does smoke and has been doing so for over 30 years. Most likely due to post nasal drip.   Today's spirometry showed: possible restrictive disease.  Monitor symptoms. If persistent will do further work up at that time.  Decrease smoking.

## 2018-10-14 NOTE — Patient Instructions (Addendum)
Will review medical records from your previous allergist.   May use over the counter antihistamines such as Zyrtec (cetirizine), Claritin (loratadine), Allegra (fexofenadine), or Xyzal (levocetirizine) daily as needed.  May use Flonase 1-2 sprays daily as needed for stuffy nose. May use azelastine eye drops 1 drop in each eye twice a day as needed for itchy eyes.   Decrease smoking  Follow up in 4 months

## 2018-10-14 NOTE — Assessment & Plan Note (Signed)
Perennial rhino conjunctivitis symptoms for the past 5+ years with worsening in the spring. Patient was receiving allergy injections weekly for about 5 years with good benefit at [facility] allergy. He needs to establish care with new allergist due to change in his insurance. Last skin testing was a few months ago - in process of obtaining records.  Will review medical records from your previous allergist and will order additional testing if necessary. Otherwise will create serum from last testing results.   Allergy immunotherapy consent form is signed.  May use over the counter antihistamines such as Zyrtec (cetirizine), Claritin (loratadine), Allegra (fexofenadine), or Xyzal (levocetirizine) daily as needed.  May use Flonase 1-2 sprays daily as needed for stuffy nose.  May use azelastine eye drops 1 drop in each eye twice a day as needed for itchy eyes.

## 2018-10-14 NOTE — Assessment & Plan Note (Signed)
.   See assessment and plan as above. 

## 2018-10-14 NOTE — Telephone Encounter (Signed)
Medical Release form has been completed and signed by the patient and faxed to [facility] in [place], [place] for all records and allergy and vial prescriptions to be released to [name]. Medical release form has been labeled and placed in bulk scanning.

## 2018-10-22 NOTE — Addendum Note (Signed)
Addended by: Garnet Sierras on: 10/22/2018 01:39 PM   Modules accepted: Orders

## 2018-10-22 NOTE — Telephone Encounter (Signed)
Called and left voicemail asking for patient to return call to discuss allergy vials.

## 2018-10-22 NOTE — Telephone Encounter (Signed)
Please call patient.   Received and reviewed documents.  07/06/2018 skin testing was positive to tree pollen, grass pollen, ragweed, weed, mold, dust mites, cockroach, cat, feathers. Orders placed for allergy immunotherapy.  See if he was able to get his vials from the previous allergist.

## 2018-10-25 DIAGNOSIS — J301 Allergic rhinitis due to pollen: Secondary | ICD-10-CM

## 2018-10-25 NOTE — Progress Notes (Addendum)
VIALS EXP 10-26-2019.  Reprint labels.

## 2018-10-26 DIAGNOSIS — J3089 Other allergic rhinitis: Secondary | ICD-10-CM

## 2018-10-26 NOTE — Telephone Encounter (Signed)
Patient called back and stated that they were going to the doctor's office today to pick up their allergy vials. Advised that the patient's appointment is on 10/28/2018 and if there are any issues with him picking up his vials to please advise the office. Patient verbalized understanding.

## 2018-10-26 NOTE — Telephone Encounter (Signed)
Patient had questions about his vials and transferring them. Please call.

## 2018-10-26 NOTE — Telephone Encounter (Signed)
Made a second attempt to call the patient and left a voicemail asking to return call to ensure that the patient has picked up his allergy vials. The patient does have an appointment on 10/28/2018 to transfer vials to our [facility] office and continue allergy injections.

## 2018-10-27 NOTE — Telephone Encounter (Signed)
If having issues picking up his vial then we will just start with our own vials.

## 2018-10-27 NOTE — Telephone Encounter (Signed)
Spoke with the patient and he stated that his allergy practice wanted to fill out paperwork and mail allergy vials to [name]. Informed patient that we do have his vials here in [place] and that he can come tomorrow for his appointment at 9:00am to start his new vials. Patient verbalized understanding and will be here tomorrow to start new vials made.

## 2018-10-28 ENCOUNTER — Ambulatory Visit (INDEPENDENT_AMBULATORY_CARE_PROVIDER_SITE_OTHER): Payer: PRIVATE HEALTH INSURANCE | Admitting: *Deleted

## 2018-10-28 ENCOUNTER — Other Ambulatory Visit: Payer: Self-pay | Admitting: Internal Medicine

## 2018-10-28 ENCOUNTER — Ambulatory Visit: Payer: Self-pay | Admitting: Allergy and Immunology

## 2018-10-28 DIAGNOSIS — J309 Allergic rhinitis, unspecified: Secondary | ICD-10-CM

## 2018-10-28 DIAGNOSIS — E782 Mixed hyperlipidemia: Secondary | ICD-10-CM

## 2018-10-28 NOTE — Progress Notes (Signed)
Immunotherapy   Patient Details  Name: Kevin Graham MRN: [id number] Date of Birth: November 07, 1970  10/28/2018  Terrial Rhodes started injections for  Mold-Dmite-Cat-Cockroach & Tree-Grass-Ragweed-Weed. Following schedule: B  Frequency:2 times per week Epi-Pen:Epi-Pen Available  Consent signed and patient instructions given. No problems after 30 minutes in the office.   Horris Latino 10/28/2018, 9:02 AM

## 2018-11-24 ENCOUNTER — Ambulatory Visit (INDEPENDENT_AMBULATORY_CARE_PROVIDER_SITE_OTHER): Payer: PRIVATE HEALTH INSURANCE

## 2018-11-24 DIAGNOSIS — J309 Allergic rhinitis, unspecified: Secondary | ICD-10-CM | POA: Diagnosis not present

## 2018-11-30 ENCOUNTER — Ambulatory Visit (INDEPENDENT_AMBULATORY_CARE_PROVIDER_SITE_OTHER): Payer: PRIVATE HEALTH INSURANCE | Admitting: *Deleted

## 2018-11-30 DIAGNOSIS — J309 Allergic rhinitis, unspecified: Secondary | ICD-10-CM | POA: Diagnosis not present

## 2018-12-09 ENCOUNTER — Ambulatory Visit (INDEPENDENT_AMBULATORY_CARE_PROVIDER_SITE_OTHER): Payer: PRIVATE HEALTH INSURANCE | Admitting: *Deleted

## 2018-12-09 DIAGNOSIS — J309 Allergic rhinitis, unspecified: Secondary | ICD-10-CM | POA: Diagnosis not present

## 2018-12-22 ENCOUNTER — Ambulatory Visit (INDEPENDENT_AMBULATORY_CARE_PROVIDER_SITE_OTHER): Payer: PRIVATE HEALTH INSURANCE | Admitting: *Deleted

## 2018-12-22 DIAGNOSIS — J309 Allergic rhinitis, unspecified: Secondary | ICD-10-CM | POA: Diagnosis not present

## 2018-12-24 ENCOUNTER — Ambulatory Visit (INDEPENDENT_AMBULATORY_CARE_PROVIDER_SITE_OTHER): Payer: PRIVATE HEALTH INSURANCE

## 2018-12-24 DIAGNOSIS — J309 Allergic rhinitis, unspecified: Secondary | ICD-10-CM

## 2018-12-30 ENCOUNTER — Ambulatory Visit (INDEPENDENT_AMBULATORY_CARE_PROVIDER_SITE_OTHER): Payer: PRIVATE HEALTH INSURANCE | Admitting: *Deleted

## 2018-12-30 DIAGNOSIS — J309 Allergic rhinitis, unspecified: Secondary | ICD-10-CM

## 2019-01-19 ENCOUNTER — Ambulatory Visit (INDEPENDENT_AMBULATORY_CARE_PROVIDER_SITE_OTHER): Payer: PRIVATE HEALTH INSURANCE | Admitting: *Deleted

## 2019-01-19 DIAGNOSIS — J309 Allergic rhinitis, unspecified: Secondary | ICD-10-CM

## 2019-01-26 ENCOUNTER — Ambulatory Visit (INDEPENDENT_AMBULATORY_CARE_PROVIDER_SITE_OTHER): Payer: PRIVATE HEALTH INSURANCE

## 2019-01-26 DIAGNOSIS — J309 Allergic rhinitis, unspecified: Secondary | ICD-10-CM

## 2019-01-31 ENCOUNTER — Encounter: Payer: Self-pay | Admitting: Internal Medicine

## 2019-02-08 ENCOUNTER — Ambulatory Visit (INDEPENDENT_AMBULATORY_CARE_PROVIDER_SITE_OTHER): Payer: PRIVATE HEALTH INSURANCE

## 2019-02-08 DIAGNOSIS — J309 Allergic rhinitis, unspecified: Secondary | ICD-10-CM | POA: Diagnosis not present

## 2019-02-14 ENCOUNTER — Ambulatory Visit: Payer: PRIVATE HEALTH INSURANCE | Admitting: Allergy

## 2019-02-14 NOTE — Progress Notes (Deleted)
Follow Up Note  RE: Kevin Graham MRN: [id number] DOB: Feb 17, 1971 Date of Office Visit: 02/14/2019  Referring provider: Jearld Fenton, NP Primary care provider: Jearld Fenton, NP  Chief Complaint: No chief complaint on file.  History of Present Illness: I had the pleasure of seeing Kevin Graham for a follow up visit at the [facility] on 02/14/2019. He is a 48 y.o. male, who is being followed for allergic rhinoconjunctivitis and coughing. Today he is here for regular follow up visit. His previous allergy office visit was on 10/14/2018 with Dr. Maudie Mercury.   Other allergic rhinitis Perennial rhino conjunctivitis symptoms for the past 5+ years with worsening in the spring. Patient was receiving allergy injections weekly for about 5 years with good benefit at [facility] allergy. He needs to establish care with new allergist due to change in his insurance. Last skin testing was a few months ago - in process of obtaining records.  Will review medical records from your previous allergist and will order additional testing if necessary. Otherwise will create serum from last testing results.   Allergy immunotherapy consent form is signed.  May use over the counter antihistamines such as Zyrtec (cetirizine), Claritin (loratadine), Allegra (fexofenadine), or Xyzal (levocetirizine) daily as needed.  May use Flonase 1-2 sprays daily as needed for stuffy nose.  May use azelastine eye drops 1 drop in each eye twice a day as needed for itchy eyes.   Allergic conjunctivitis of both eyes See assessment and plan as above.  Coughing Complaining of some coughing the last few months. No other symptoms. He does smoke and has been doing so for over 30 years. Most likely due to post nasal drip.   Today's spirometry showed: possible restrictive disease.  Monitor symptoms. If persistent will do further work up at that time.  Decrease smoking.   Assessment and Plan: Kevin Graham is a 48 y.o. male  with: No problem-specific Assessment & Plan notes found for this encounter.  No follow-ups on file.  No orders of the defined types were placed in this encounter.  Lab Orders  No laboratory test(s) ordered today    Diagnostics: Spirometry:  Tracings reviewed. His effort: {Blank single:19197::"Good reproducible efforts.","It was hard to get consistent efforts and there is a question as to whether this reflects a maximal maneuver.","Poor effort, data can not be interpreted."} FVC: ***L FEV1: ***L, ***% predicted FEV1/FVC ratio: ***% Interpretation: {Blank single:19197::"Spirometry consistent with mild obstructive disease","Spirometry consistent with moderate obstructive disease","Spirometry consistent with severe obstructive disease","Spirometry consistent with possible restrictive disease","Spirometry consistent with mixed obstructive and restrictive disease","Spirometry uninterpretable due to technique","Spirometry consistent with normal pattern","No overt abnormalities noted given today's efforts"}.  Please see scanned spirometry results for details.  Skin Testing: {Blank single:19197::"Select foods","Environmental allergy panel","Environmental allergy panel and select foods","Food allergy panel","None","Deferred due to recent antihistamines use"}. Positive test to: ***. Negative test to: ***.  Results discussed with patient/family.   Medication List:  Current Outpatient Medications  Medication Sig Dispense Refill  . azelastine (OPTIVAR) 0.05 % ophthalmic solution Place 1 drop into both eyes daily as needed. As needed 6 mL 5  . cetirizine (ZYRTEC) 10 MG tablet Take 10 mg by mouth daily as needed for allergies.    Marland Kitchen EPINEPHrine (EPIPEN 2-PAK) 0.3 mg/0.3 mL IJ SOAJ injection Inject 0.3 mg into the muscle once.    . famotidine (PEPCID) 10 MG tablet Take 10 mg by mouth 2 (two) times daily.    . fenofibrate 160 MG tablet TAKE 1  TABLET BY MOUTH EVERY DAY 30 tablet 1  . fluticasone  (FLONASE) 50 MCG/ACT nasal spray Place 1 spray into both nostrils 2 (two) times daily as needed. 16 g 5  . gabapentin (NEURONTIN) 100 MG capsule Take 100 mg twice a day for one week, then increase to 200 mg(2 tablets) twice a day and continue    . pantoprazole (PROTONIX) 40 MG tablet Take 1 tablet (40 mg total) daily by mouth. 30 tablet 4  . simvastatin (ZOCOR) 40 MG tablet TAKE 1 TABLET BY MOUTH EVERYDAY AT BEDTIME 30 tablet 1   No current facility-administered medications for this visit.    Allergies: No Known Allergies I reviewed his past medical history, social history, family history, and environmental history and no significant changes have been reported from previous visit on 10/14/2018.  Review of Systems  Constitutional: Negative for appetite change, chills, fever and unexpected weight change.  HENT: Positive for congestion and rhinorrhea.   Eyes: Positive for itching.  Respiratory: Positive for cough. Negative for chest tightness, shortness of breath and wheezing.   Cardiovascular: Negative for chest pain.  Gastrointestinal: Negative for abdominal pain.  Genitourinary: Negative for difficulty urinating.  Skin: Negative for rash.  Allergic/Immunologic: Positive for environmental allergies. Negative for food allergies.  Neurological: Negative for headaches.   Objective: There were no vitals taken for this visit. There is no height or weight on file to calculate BMI. Physical Exam  Constitutional: He is oriented to person, place, and time. He appears well-developed and well-nourished.  HENT:  Head: Normocephalic and atraumatic.  Right Ear: External ear normal.  Left Ear: External ear normal.  Nose: Nose normal.  Mouth/Throat: Oropharynx is clear and moist.  Eyes: Conjunctivae and EOM are normal.  Neck: Neck supple.  Cardiovascular: Normal rate, regular rhythm and normal heart sounds. Exam reveals no gallop and no friction rub.  No murmur heard. Pulmonary/Chest: Effort  normal and breath sounds normal. He has no wheezes. He has no rales.  Abdominal: Soft.  Neurological: He is alert and oriented to person, place, and time.  Skin: Skin is warm. No rash noted.  Psychiatric: He has a normal mood and affect. His behavior is normal.  Nursing note and vitals reviewed.  Previous notes and tests were reviewed. The plan was reviewed with the patient/family, and all questions/concerned were addressed.  It was my pleasure to see Kavontae today and participate in his care. Please feel free to contact me with any questions or concerns.  Sincerely,  Rexene Alberts, DO Allergy & Immunology  [facility] office: [phone] [facility] office: [phone]

## 2019-02-22 ENCOUNTER — Ambulatory Visit (INDEPENDENT_AMBULATORY_CARE_PROVIDER_SITE_OTHER): Payer: PRIVATE HEALTH INSURANCE | Admitting: *Deleted

## 2019-02-22 DIAGNOSIS — J309 Allergic rhinitis, unspecified: Secondary | ICD-10-CM | POA: Diagnosis not present

## 2019-03-02 ENCOUNTER — Ambulatory Visit (INDEPENDENT_AMBULATORY_CARE_PROVIDER_SITE_OTHER): Payer: PRIVATE HEALTH INSURANCE

## 2019-03-02 DIAGNOSIS — J309 Allergic rhinitis, unspecified: Secondary | ICD-10-CM | POA: Diagnosis not present

## 2019-03-11 ENCOUNTER — Ambulatory Visit (INDEPENDENT_AMBULATORY_CARE_PROVIDER_SITE_OTHER): Payer: PRIVATE HEALTH INSURANCE | Admitting: *Deleted

## 2019-03-11 DIAGNOSIS — J309 Allergic rhinitis, unspecified: Secondary | ICD-10-CM | POA: Diagnosis not present

## 2019-03-29 IMAGING — CT CT ANGIO NECK
1 of 11 series · 5 of 33 positions shown · IV contrast (isovue)
Comparison: None.

CLINICAL DATA: Initial evaluation for intermittent episodes of
weakness and dizziness, left-sided numbness, slurred speech.

EXAM:
CT ANGIOGRAPHY HEAD AND NECK
TECHNIQUE: Multidetector CT imaging of the head and neck was performed using
the standard protocol during bolus administration of intravenous
contrast. Multiplanar CT image reconstructions and MIPs were
obtained to evaluate the vascular anatomy. Carotid stenosis
measurements (when applicable) are obtained utilizing NASCET
criteria, using the distal internal carotid diameter as the
denominator.
CONTRAST:  100 cc of Isovue 370.

[Series 11: ax thin · axial · 0.39mm/px · z∈[-307,-56]mm · 5 of 383 slices shown]
[im 64/383  soft-tissue]
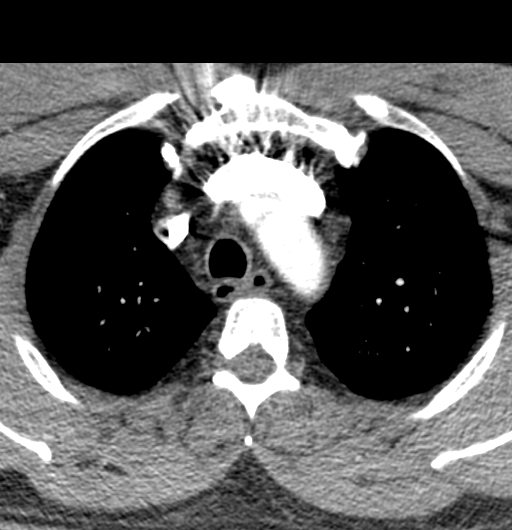
[im 128/383  bone]
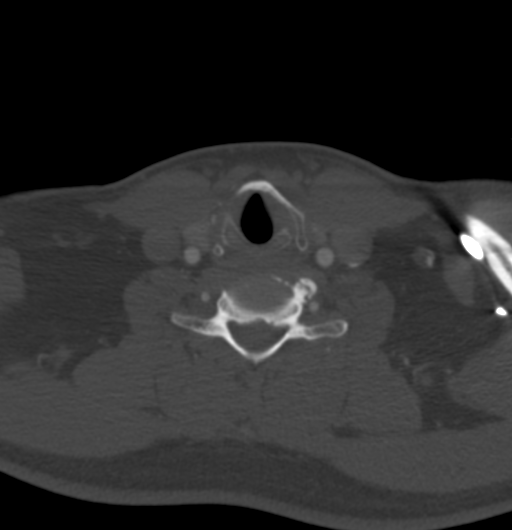
[im 192/383  soft-tissue]
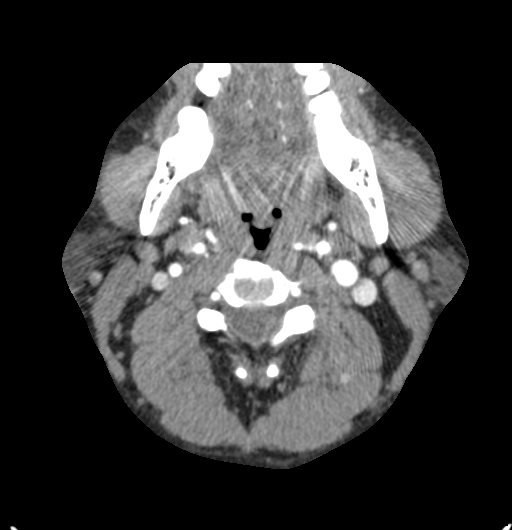
[im 255/383  bone]
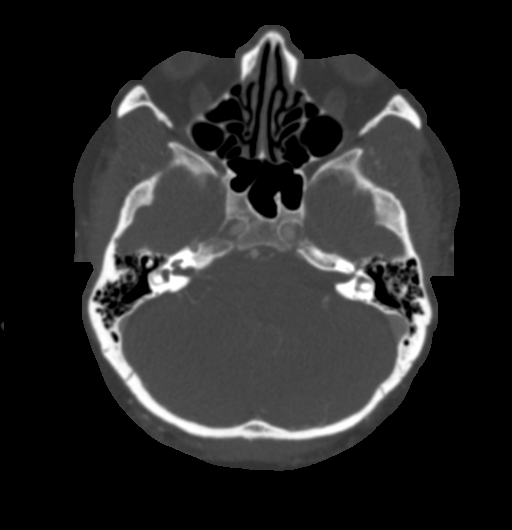
[im 319/383  soft-tissue]
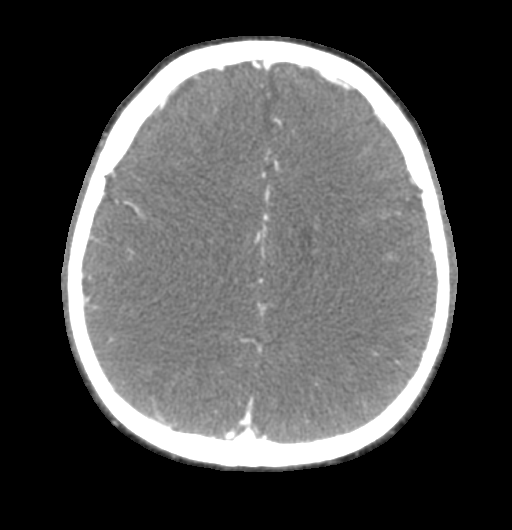

[5 of 33 positions shown; findings below may reference images not displayed]

FINDINGS: CT HEAD FINDINGS

Brain: Mildly advanced cerebral atrophy for patient age. There are a
few scattered hypodensities position within the deep/subcortical
white matter of the bilateral parietal lobes (series 5, image 17),
nonspecific. No evidence for acute intracranial hemorrhage. No
evidence for acute large vessel territory infarct. No mass lesion,
midline shift or mass effect. No hydrocephalus. No extra-axial fluid
collection.

Vascular: No hyperdense vessel.

Skull: Scalp soft tissues within normal limits.Calvarium intact.

Sinuses/Orbits: Globes and orbital soft tissues are within normal
limits.

Visualized paranasal sinuses are clear. No mastoid effusion.

CTA NECK FINDINGS

Aortic arch: Visualized aortic arch of normal caliber with normal 3
vessel morphology. Minimal atheromatous plaque within the proximal
descending intrathoracic aorta. No flow-limiting stenosis about the
origin of the great vessels. Visualized subclavian artery is widely
patent.

Right carotid system: Right common and internal carotid artery's
widely patent without stenosis, dissection, or occlusion. No
flow-limiting stenosis about the right carotid bifurcation para

Left carotid system: Left common and internal carotid artery's
widely patent without stenosis, dissection, or occlusion. Minimal
calcified plaque at the proximal left ICA without stenosis.

Vertebral arteries: Both of the vertebral arteries arise from the
subclavian arteries. Vertebral arteries widely patent within the
neck without stenosis, dissection, or occlusion.

Skeleton: No acute osseous abnormality. No worrisome lytic or
blastic osseous lesions. Mild multilevel facet arthropathy noted
within the cervical spine.

Other neck: No acute soft tissue abnormality within the neck.
Salivary glands normal. Thyroid normal. No adenopathy.

Upper chest: Visualized upper chest within normal limits. Partially
visualized lungs are clear.

Review of the MIP images confirms the above findings

CTA HEAD FINDINGS

Anterior circulation: Petrous, cavernous, and supraclinoid segments
widely patent bilaterally without stenosis. ICA termini widely
patent. Left A1 segment dominant and widely patent. Right A1 segment
hypoplastic, which likely accounts for the slightly diminutive right
ICA is compared to the left. Anterior communicating artery within
normal limits. Anterior cerebral arteries patent to their distal
aspects. M1 segments patent without stenosis or occlusion. No
proximal M2 occlusion. Distal MCA branches well opacified and
symmetric.

Posterior circulation: Vertebral artery's patent to the
vertebrobasilar junction without stenosis. Left vertebral artery
slightly dominant. Posterior inferior cerebral arteries patent
bilaterally. Basilar artery widely patent. Superior cerebellar is
patent bilaterally. Both of the posterior cerebral arteries
primarily supplied via the basilar and are widely patent to their
distal aspects.

Venous sinuses: Patent.

Anatomic variants: No significant anatomic variant. No aneurysm or
vascular malformation.

Delayed phase: No pathologic enhancement.

Review of the MIP images confirms the above findings
IMPRESSION: 1. Normal CTA of the head and neck.
2. No acute intracranial process.
3. Patchy hypodensities within the subcortical/deep white matter of
the bilateral parietal lobes. Findings are nonspecific. Primary
differential considerations includes accelerated chronic small
vessel ischemic disease. Possible demyelinating disease could also
be considered.
4. Mildly advanced cerebral atrophy for age.

## 2019-04-04 ENCOUNTER — Ambulatory Visit (INDEPENDENT_AMBULATORY_CARE_PROVIDER_SITE_OTHER): Payer: PRIVATE HEALTH INSURANCE

## 2019-04-04 DIAGNOSIS — J309 Allergic rhinitis, unspecified: Secondary | ICD-10-CM | POA: Diagnosis not present

## 2019-04-05 ENCOUNTER — Encounter: Payer: Self-pay | Admitting: Internal Medicine

## 2019-04-19 ENCOUNTER — Ambulatory Visit (INDEPENDENT_AMBULATORY_CARE_PROVIDER_SITE_OTHER): Payer: PRIVATE HEALTH INSURANCE

## 2019-04-19 DIAGNOSIS — J3089 Other allergic rhinitis: Secondary | ICD-10-CM

## 2019-05-04 ENCOUNTER — Other Ambulatory Visit: Payer: Self-pay | Admitting: Internal Medicine

## 2019-05-12 ENCOUNTER — Ambulatory Visit (INDEPENDENT_AMBULATORY_CARE_PROVIDER_SITE_OTHER): Payer: PRIVATE HEALTH INSURANCE | Admitting: *Deleted

## 2019-05-12 DIAGNOSIS — J309 Allergic rhinitis, unspecified: Secondary | ICD-10-CM

## 2019-05-24 ENCOUNTER — Ambulatory Visit (INDEPENDENT_AMBULATORY_CARE_PROVIDER_SITE_OTHER): Payer: PRIVATE HEALTH INSURANCE | Admitting: *Deleted

## 2019-05-24 DIAGNOSIS — J309 Allergic rhinitis, unspecified: Secondary | ICD-10-CM | POA: Diagnosis not present

## 2019-05-26 ENCOUNTER — Ambulatory Visit (INDEPENDENT_AMBULATORY_CARE_PROVIDER_SITE_OTHER): Payer: PRIVATE HEALTH INSURANCE | Admitting: Internal Medicine

## 2019-05-26 ENCOUNTER — Encounter: Payer: Self-pay | Admitting: Internal Medicine

## 2019-05-26 ENCOUNTER — Other Ambulatory Visit: Payer: Self-pay

## 2019-05-26 VITALS — BP 122/82 | HR 68 | Temp 97.8°F | Ht 62.25 in | Wt 158.0 lb

## 2019-05-26 DIAGNOSIS — E782 Mixed hyperlipidemia: Secondary | ICD-10-CM

## 2019-05-26 DIAGNOSIS — Z0000 Encounter for general adult medical examination without abnormal findings: Secondary | ICD-10-CM

## 2019-05-26 DIAGNOSIS — Z23 Encounter for immunization: Secondary | ICD-10-CM

## 2019-05-26 DIAGNOSIS — K219 Gastro-esophageal reflux disease without esophagitis: Secondary | ICD-10-CM | POA: Diagnosis not present

## 2019-05-26 DIAGNOSIS — R202 Paresthesia of skin: Secondary | ICD-10-CM

## 2019-05-26 LAB — COMPREHENSIVE METABOLIC PANEL
ALT: 39 U/L (ref 0–53)
AST: 27 U/L (ref 0–37)
Albumin: 4.8 g/dL (ref 3.5–5.2)
Alkaline Phosphatase: 47 U/L (ref 39–117)
BUN: 15 mg/dL (ref 6–23)
CO2: 29 mEq/L (ref 19–32)
Calcium: 10.6 mg/dL — ABNORMAL HIGH (ref 8.4–10.5)
Chloride: 103 mEq/L (ref 96–112)
Creatinine, Ser: 0.96 mg/dL (ref 0.40–1.50)
GFR: 83.6 mL/min (ref >60.00)
Glucose, Bld: 94 mg/dL (ref 70–99)
Potassium: 4.4 mEq/L (ref 3.5–5.1)
Sodium: 139 mEq/L (ref 135–145)
Total Bilirubin: 0.5 mg/dL (ref 0.2–1.2)
Total Protein: 7.5 g/dL (ref 6.0–8.3)

## 2019-05-26 LAB — CBC
HCT: 46 % (ref 39.0–52.0)
Hemoglobin: 15.5 g/dL (ref 13.0–17.0)
MCHC: 33.7 g/dL (ref 30.0–36.0)
MCV: 95.4 fl (ref 78.0–100.0)
Platelets: 165 10*3/uL (ref 150.0–400.0)
RBC: 4.83 Mil/uL (ref 4.22–5.81)
RDW: 13.2 % (ref 11.5–15.5)
WBC: 5.6 10*3/uL (ref 4.0–10.5)

## 2019-05-26 LAB — LIPID PANEL
Cholesterol: 202 mg/dL — ABNORMAL HIGH (ref 0–200)
HDL: 46.2 mg/dL (ref >39.00)
Total CHOL/HDL Ratio: 4
Triglycerides: 527 mg/dL — ABNORMAL HIGH (ref 0.0–149.0)

## 2019-05-26 LAB — LDL CHOLESTEROL, DIRECT: Direct LDL: 100 mg/dL

## 2019-05-26 NOTE — Progress Notes (Signed)
Subjective:    Patient ID: Kevin Graham, male    DOB: 30-Aug-1971, 48 y.o.   MRN: [id number]  HPI  Pt presents to the clinic today for his annual exam. He is also due to follow up chronic conditions.  GERD: He is not sure what triggers this. He denies breakthrough on Pantoprazole and Famotidine. There is no upper GI on file.  HLD: His last LDL was 117, triglycerides 444, 08/2018. He denies myalgias on Simvastatin. He is not taking Fenofibrate because "it makes me feel bad". He does not consume a low fat diet.  Paresthesia of BUE/BLE: Vit D, B12 and Folate were all normal. This is managed on Gabapentin. He has seen neurology in the past for the same.  Flu: 06/2016 Tetanus: 05/2016 Vision Screening: as needed Dentist: biannually  Diet: He does eat meat. He consumes fruits and veggies daily. He does eat fried foods. He drinks water,  beer. Exercise: None  Review of Systems  Past Medical History:  Diagnosis Date  . Environmental allergies   . History of stomach ulcers   . Seasonal allergies     Current Outpatient Medications  Medication Sig Dispense Refill  . azelastine (OPTIVAR) 0.05 % ophthalmic solution Place 1 drop into both eyes daily as needed. As needed 6 mL 5  . cetirizine (ZYRTEC) 10 MG tablet Take 10 mg by mouth daily as needed for allergies.    Marland Kitchen EPINEPHrine (EPIPEN 2-PAK) 0.3 mg/0.3 mL IJ SOAJ injection Inject 0.3 mg into the muscle once.    . famotidine (PEPCID) 10 MG tablet Take 10 mg by mouth 2 (two) times daily.    . fenofibrate 160 MG tablet TAKE 1 TABLET BY MOUTH EVERY DAY 30 tablet 1  . fluticasone (FLONASE) 50 MCG/ACT nasal spray Place 1 spray into both nostrils 2 (two) times daily as needed. 16 g 5  . gabapentin (NEURONTIN) 100 MG capsule Take 100 mg twice a day for one week, then increase to 200 mg(2 tablets) twice a day and continue    . pantoprazole (PROTONIX) 40 MG tablet TAKE 1 TABLET BY MOUTH EVERY DAY 30 tablet 0  . simvastatin (ZOCOR) 40 MG tablet TAKE  1 TABLET BY MOUTH EVERYDAY AT BEDTIME 30 tablet 1   No current facility-administered medications for this visit.     No Known Allergies  Family History  Problem Relation Age of Onset  . Hyperlipidemia Mother     Social History   Socioeconomic History  . Marital status: Single    Spouse name: Not on file  . Number of children: Not on file  . Years of education: Not on file  . Highest education level: Not on file  Occupational History  . Not on file  Social Needs  . Financial resource strain: Not on file  . Food insecurity    Worry: Not on file    Inability: Not on file  . Transportation needs    Medical: Not on file    Non-medical: Not on file  Tobacco Use  . Smoking status: Current Every Day Smoker    Packs/day: 0.25    Years: 38.00    Pack years: 9.50    Types: Cigarettes  . Smokeless tobacco: Never Used  . Tobacco comment: 2 packs per week  Substance and Sexual Activity  . Alcohol use: Yes    Alcohol/week: 3.0 standard drinks    Types: 3 Cans of beer per week    Comment: 1-3 beers during the week, more on the  weekends  . Drug use: No  . Sexual activity: Yes    Partners: Female  Lifestyle  . Physical activity    Days per week: Not on file    Minutes per session: Not on file  . Stress: Not on file  Relationships  . Social Herbalist on phone: Not on file    Gets together: Not on file    Attends religious service: Not on file    Active member of club or organization: Not on file    Attends meetings of clubs or organizations: Not on file    Relationship status: Not on file  . Intimate partner violence    Fear of current or ex partner: Not on file    Emotionally abused: Not on file    Physically abused: Not on file    Forced sexual activity: Not on file  Other Topics Concern  . Not on file  Social History Narrative   Lives with girlfriend and her child in a one bedroom home.  Has 5 children.  Works at [employer].  Education: high school.      Constitutional: Denies fever, malaise, fatigue, headache or abrupt weight changes.  HEENT: Denies eye pain, eye redness, ear pain, ringing in the ears, wax buildup, runny nose, nasal congestion, bloody nose, or sore throat. Respiratory: Denies difficulty breathing, shortness of breath, cough or sputum production.   Cardiovascular: Denies chest pain, chest tightness, palpitations or swelling in the hands or feet.  Gastrointestinal: Denies abdominal pain, bloating, constipation, diarrhea or blood in the stool.  GU: Denies urgency, frequency, pain with urination, burning sensation, blood in urine, odor or discharge. Musculoskeletal: Denies decrease in range of motion, difficulty with gait, muscle pain or joint pain and swelling.  Skin: Denies redness, rashes, lesions or ulcercations.  Neurological: Denies dizziness, difficulty with memory, difficulty with speech or problems with balance and coordination.  Psych: Denies anxiety, depression, SI/HI.  No other specific complaints in a complete review of systems (except as listed in HPI above).      Objective:   Physical Exam        Assessment & Plan:

## 2019-05-26 NOTE — Patient Instructions (Signed)

## 2019-05-26 NOTE — Assessment & Plan Note (Signed)
CMET and Lipid profile today Encouraged him to consume a low fat diet Continue Simvastatin for now Consider adding Zetia if unable to tolerate Fenofibrate

## 2019-05-26 NOTE — Assessment & Plan Note (Signed)
Stable on Pantoprazole and Famotadine CBC and CMET today Discussed cutting back on ETOH, avoid spicy foods

## 2019-05-26 NOTE — Assessment & Plan Note (Signed)
Continue Gabapentin He will continue to follow with neurology Encouraged abstinence from ETOH

## 2019-06-02 ENCOUNTER — Ambulatory Visit (INDEPENDENT_AMBULATORY_CARE_PROVIDER_SITE_OTHER): Payer: PRIVATE HEALTH INSURANCE | Admitting: *Deleted

## 2019-06-02 DIAGNOSIS — J309 Allergic rhinitis, unspecified: Secondary | ICD-10-CM | POA: Diagnosis not present

## 2019-06-03 ENCOUNTER — Other Ambulatory Visit: Payer: Self-pay | Admitting: Internal Medicine

## 2019-06-03 DIAGNOSIS — E782 Mixed hyperlipidemia: Secondary | ICD-10-CM

## 2019-06-03 MED ORDER — EZETIMIBE 10 MG PO TABS: 10.0000 mg | ORAL_TABLET | Freq: Every day | ORAL | 2 refills | Status: DC

## 2019-06-03 NOTE — Addendum Note (Signed)
Addended by: Lurlean Nanny on: 06/03/2019 05:19 PM   Modules accepted: Orders

## 2019-06-07 ENCOUNTER — Ambulatory Visit (INDEPENDENT_AMBULATORY_CARE_PROVIDER_SITE_OTHER): Payer: PRIVATE HEALTH INSURANCE | Admitting: *Deleted

## 2019-06-07 DIAGNOSIS — J309 Allergic rhinitis, unspecified: Secondary | ICD-10-CM

## 2019-06-16 ENCOUNTER — Ambulatory Visit (INDEPENDENT_AMBULATORY_CARE_PROVIDER_SITE_OTHER): Payer: PRIVATE HEALTH INSURANCE

## 2019-06-16 DIAGNOSIS — J309 Allergic rhinitis, unspecified: Secondary | ICD-10-CM

## 2019-06-27 ENCOUNTER — Ambulatory Visit (INDEPENDENT_AMBULATORY_CARE_PROVIDER_SITE_OTHER): Payer: PRIVATE HEALTH INSURANCE

## 2019-06-27 DIAGNOSIS — J309 Allergic rhinitis, unspecified: Secondary | ICD-10-CM | POA: Diagnosis not present

## 2019-07-12 ENCOUNTER — Ambulatory Visit (INDEPENDENT_AMBULATORY_CARE_PROVIDER_SITE_OTHER): Payer: PRIVATE HEALTH INSURANCE | Admitting: *Deleted

## 2019-07-12 DIAGNOSIS — J309 Allergic rhinitis, unspecified: Secondary | ICD-10-CM | POA: Diagnosis not present

## 2019-07-21 ENCOUNTER — Telehealth: Payer: Self-pay

## 2019-07-21 NOTE — Telephone Encounter (Signed)
Pt started on 07/20/19 with red eyes,eyes have burning sensation,eyes are swollen this morning and small amt of drainage. Pt has chills,H/A and does not feel well. Pt will go to UC for eval. Pt does not have way to ck vitals. FYI to Avie Echevaria NP.

## 2019-07-22 NOTE — Telephone Encounter (Signed)
This did not require UC. I would have added him on, but I was not asked.

## 2019-08-05 ENCOUNTER — Ambulatory Visit (INDEPENDENT_AMBULATORY_CARE_PROVIDER_SITE_OTHER): Payer: PRIVATE HEALTH INSURANCE | Admitting: *Deleted

## 2019-08-05 DIAGNOSIS — J309 Allergic rhinitis, unspecified: Secondary | ICD-10-CM | POA: Diagnosis not present

## 2019-08-10 ENCOUNTER — Ambulatory Visit (INDEPENDENT_AMBULATORY_CARE_PROVIDER_SITE_OTHER): Payer: PRIVATE HEALTH INSURANCE | Admitting: *Deleted

## 2019-08-10 DIAGNOSIS — J309 Allergic rhinitis, unspecified: Secondary | ICD-10-CM

## 2019-09-01 ENCOUNTER — Ambulatory Visit (INDEPENDENT_AMBULATORY_CARE_PROVIDER_SITE_OTHER): Payer: PRIVATE HEALTH INSURANCE | Admitting: *Deleted

## 2019-09-01 DIAGNOSIS — J309 Allergic rhinitis, unspecified: Secondary | ICD-10-CM | POA: Diagnosis not present

## 2019-09-05 ENCOUNTER — Other Ambulatory Visit (INDEPENDENT_AMBULATORY_CARE_PROVIDER_SITE_OTHER): Payer: PRIVATE HEALTH INSURANCE

## 2019-09-05 ENCOUNTER — Other Ambulatory Visit: Payer: Self-pay

## 2019-09-05 DIAGNOSIS — E782 Mixed hyperlipidemia: Secondary | ICD-10-CM | POA: Diagnosis not present

## 2019-09-05 LAB — LIPID PANEL
Cholesterol: 168 mg/dL (ref 0–200)
HDL: 45.3 mg/dL (ref >39.00)
Total CHOL/HDL Ratio: 4
Triglycerides: 440 mg/dL — ABNORMAL HIGH (ref 0.0–149.0)

## 2019-09-05 LAB — LDL CHOLESTEROL, DIRECT: Direct LDL: 82 mg/dL

## 2019-09-07 ENCOUNTER — Encounter: Payer: Self-pay | Admitting: Internal Medicine

## 2019-09-13 ENCOUNTER — Ambulatory Visit (INDEPENDENT_AMBULATORY_CARE_PROVIDER_SITE_OTHER): Payer: PRIVATE HEALTH INSURANCE

## 2019-09-13 DIAGNOSIS — J309 Allergic rhinitis, unspecified: Secondary | ICD-10-CM | POA: Diagnosis not present

## 2019-09-18 ENCOUNTER — Other Ambulatory Visit: Payer: Self-pay | Admitting: Internal Medicine

## 2019-09-19 DIAGNOSIS — J3089 Other allergic rhinitis: Secondary | ICD-10-CM | POA: Diagnosis not present

## 2019-09-19 NOTE — Progress Notes (Signed)
VIALS EXP 09-18-20 

## 2019-10-17 ENCOUNTER — Other Ambulatory Visit: Payer: Self-pay

## 2019-10-17 ENCOUNTER — Encounter: Payer: Self-pay | Admitting: Allergy

## 2019-10-17 ENCOUNTER — Ambulatory Visit (INDEPENDENT_AMBULATORY_CARE_PROVIDER_SITE_OTHER): Payer: 59 | Admitting: Allergy

## 2019-10-17 ENCOUNTER — Ambulatory Visit: Payer: Self-pay

## 2019-10-17 VITALS — BP 140/90 | HR 88 | Temp 98.1°F | Resp 16 | Ht 63.0 in | Wt 165.0 lb

## 2019-10-17 DIAGNOSIS — R05 Cough: Secondary | ICD-10-CM | POA: Diagnosis not present

## 2019-10-17 DIAGNOSIS — H1010 Acute atopic conjunctivitis, unspecified eye: Secondary | ICD-10-CM | POA: Insufficient documentation

## 2019-10-17 DIAGNOSIS — J302 Other seasonal allergic rhinitis: Secondary | ICD-10-CM

## 2019-10-17 DIAGNOSIS — R059 Cough, unspecified: Secondary | ICD-10-CM

## 2019-10-17 DIAGNOSIS — J3089 Other allergic rhinitis: Secondary | ICD-10-CM

## 2019-10-17 DIAGNOSIS — J309 Allergic rhinitis, unspecified: Secondary | ICD-10-CM

## 2019-10-17 MED ORDER — AZELASTINE HCL 0.05 % OP SOLN: 1.0000 [drp] | Freq: Every day | OPHTHALMIC | 5 refills | Status: AC | PRN

## 2019-10-17 MED ORDER — CETIRIZINE HCL 10 MG PO TABS: 10.0000 mg | ORAL_TABLET | Freq: Every day | ORAL | 5 refills | Status: DC | PRN

## 2019-10-17 MED ORDER — FLUTICASONE PROPIONATE 50 MCG/ACT NA SUSP: 1.0000 | Freq: Two times a day (BID) | NASAL | 5 refills | Status: DC | PRN

## 2019-10-17 NOTE — Patient Instructions (Addendum)
Allergic rhino conjunctivitis   Continue allergy injections.  Ask your family doctor if they are willing to give the shots at their office.  May use over the counter antihistamines such as Zyrtec (cetirizine) daily as needed.  May use Flonase 1 spray per nostril twice a day as needed for stuffy nose.   May use azelastine eye drops 1 drop in each eye twice a day as needed for itchy eyes NOT dry eyes.  Get over the counter refresh eye drops for the dry eyes.  Get your eyes checked!  Follow up in 6 months or sooner if needed.

## 2019-10-17 NOTE — Assessment & Plan Note (Addendum)
Past history - Perennial rhino conjunctivitis symptoms for the past 5+ years with worsening in the spring. Patient was receiving allergy injections weekly for about 5 years with good benefit at [facility] allergy. He needs to establish care with new allergist due to change in his insurance. Started AIT on 10/28/2018 (Mold-Dmite-Cat-Cockroach & Tree-Grass-Ragweed-Weed) Interim history - injections seem to be helping but sometimes finds it difficult to come to our office for the weekly shots.  Continue allergy injections.  Ask PCP if they are willing to give the shots at their office as it's closer to where he lives.   May use over the counter antihistamines such as Zyrtec (cetirizine) daily as needed.  May use Flonase 1 spray per nostril twice a day as needed for stuffy nose.   May use azelastine eye drops 1 drop in each eye twice a day as needed for itchy eyes NOT dry eyes.  Get over the counter refresh eye drops for the dry eyes.  Get your eyes checked!

## 2019-10-17 NOTE — Assessment & Plan Note (Signed)
Past history - Complaining of some coughing the last few months. No other symptoms. He does smoke and has been doing so for over 30 years. 2020 spirometry showed: possible restrictive disease. Interim history - mild coughing in the morning at times which resolve after drinking water.  Monitor symptoms.  Decrease smoking.

## 2019-10-17 NOTE — Progress Notes (Signed)
Follow Up Note  RE: Kevin Graham MRN: [id number] DOB: 09-12-1971 Date of Office Visit: 10/17/2019  Referring provider: Jearld Fenton, NP Primary care provider: Jearld Fenton, NP  Chief Complaint: Allergic Rhinitis   History of Present Illness: I had the pleasure of seeing Kevin Graham for a follow up visit at the [facility] on 10/17/2019. He is a 49 y.o. male, who is being followed for allergic rhino conjunctivitis. His previous allergy office visit was on 10/14/2018 with Dr. Maudie Mercury. Today is a regular follow up visit.  He is a 49 y.o. male, who is being followed for allergic rhinoconjunctivitis and coughing. His previous allergy office visit was on 10/14/2018 with Dr. Maudie Mercury. Today is a regular follow up visit.  Allergic rhino conjunctivitis Doing well with allergy injections with minimal side effects.  Only using allergy medications  Takes Flonase 2 sprays 1-2 times a day with good benefit from November through April.  No nosebleeds.  Using eye drops 2 times a day for dry eyes.   Last eye exam was 3 years ago.   Coughing Sometimes coughing in the mornings. Still smoking sometimes.  Assessment and Plan: Kevin Graham is a 49 y.o. male with: Seasonal and perennial allergic rhinoconjunctivitis Past history - Perennial rhino conjunctivitis symptoms for the past 5+ years with worsening in the spring. Patient was receiving allergy injections weekly for about 5 years with good benefit at [facility] allergy. He needs to establish care with new allergist due to change in his insurance. Started AIT on 10/28/2018 (Mold-Dmite-Cat-Cockroach & Tree-Grass-Ragweed-Weed) Interim history - injections seem to be helping but sometimes finds it difficult to come to our office for the weekly shots.  Continue allergy injections.  Ask PCP if they are willing to give the shots at their office as it's closer to where he lives.   May use over the counter antihistamines such as Zyrtec (cetirizine)  daily as needed.  May use Flonase 1 spray per nostril twice a day as needed for stuffy nose.   May use azelastine eye drops 1 drop in each eye twice a day as needed for itchy eyes NOT dry eyes.  Get over the counter refresh eye drops for the dry eyes.  Get your eyes checked!  Coughing Past history - Complaining of some coughing the last few months. No other symptoms. He does smoke and has been doing so for over 30 years. 2020 spirometry showed: possible restrictive disease. Interim history - mild coughing in the morning at times which resolve after drinking water.  Monitor symptoms.  Decrease smoking.   Return in about 6 months (around 04/15/2020).  Meds ordered this encounter  Medications  . fluticasone (FLONASE) 50 MCG/ACT nasal spray    Sig: Place 1 spray into both nostrils 2 (two) times daily as needed.    Dispense:  16 g    Refill:  5  . cetirizine (ZYRTEC) 10 MG tablet    Sig: Take 1 tablet (10 mg total) by mouth daily as needed for allergies or rhinitis.    Dispense:  30 tablet    Refill:  5  . azelastine (OPTIVAR) 0.05 % ophthalmic solution    Sig: Place 1 drop into both eyes daily as needed (for itchy/watery eyes). As needed    Dispense:  6 mL    Refill:  5   Diagnostics: None.  Medication List:  Current Outpatient Medications  Medication Sig Dispense Refill  . azelastine (OPTIVAR) 0.05 % ophthalmic solution Place 1  drop into both eyes daily as needed (for itchy/watery eyes). As needed 6 mL 5  . EPINEPHrine (EPIPEN 2-PAK) 0.3 mg/0.3 mL IJ SOAJ injection Inject 0.3 mg into the muscle once.    . ezetimibe (ZETIA) 10 MG tablet TAKE 1 TABLET(10 MG) BY MOUTH DAILY 30 tablet 2  . famotidine (PEPCID) 10 MG tablet Take 10 mg by mouth 2 (two) times daily.    . fluticasone (FLONASE) 50 MCG/ACT nasal spray Place 1 spray into both nostrils 2 (two) times daily as needed. 16 g 5  . gabapentin (NEURONTIN) 100 MG capsule Take 100 mg twice a day for one week, then increase to 200  mg(2 tablets) twice a day and continue    . naproxen sodium (ALEVE) 220 MG tablet Take 220 mg by mouth as needed.    . pantoprazole (PROTONIX) 40 MG tablet TAKE 1 TABLET BY MOUTH EVERY DAY 30 tablet 6  . simvastatin (ZOCOR) 40 MG tablet TAKE 1 TABLET BY MOUTH AT BEDTIME 30 tablet 5  . cetirizine (ZYRTEC) 10 MG tablet Take 1 tablet (10 mg total) by mouth daily as needed for allergies or rhinitis. 30 tablet 5   No current facility-administered medications for this visit.   Allergies: No Known Allergies I reviewed his past medical history, social history, family history, and environmental history and no significant changes have been reported from his previous visit.  Review of Systems  Constitutional: Negative for appetite change, chills, fever and unexpected weight change.  HENT: Positive for congestion. Negative for rhinorrhea.   Eyes: Negative for itching.  Respiratory: Positive for cough. Negative for chest tightness, shortness of breath and wheezing.   Cardiovascular: Negative for chest pain.  Gastrointestinal: Negative for abdominal pain.  Genitourinary: Negative for difficulty urinating.  Skin: Negative for rash.  Allergic/Immunologic: Positive for environmental allergies. Negative for food allergies.  Neurological: Negative for headaches.   Objective: BP 140/90 (BP Location: Left Arm, Patient Position: Sitting, Cuff Size: Normal)   Pulse 88   Temp 98.1 F (36.7 C) (Temporal)   Resp 16   Ht 5\' 3"  (1.6 m)   Wt 165 lb (74.8 kg)   SpO2 99%   BMI 29.23 kg/m  Body mass index is 29.23 kg/m. Physical Exam  Constitutional: He is oriented to person, place, and time. He appears well-developed and well-nourished.  HENT:  Head: Normocephalic and atraumatic.  Right Ear: External ear normal.  Left Ear: External ear normal.  Nose: Nose normal.  Mouth/Throat: Oropharynx is clear and moist.  Eyes: Conjunctivae and EOM are normal.  Cardiovascular: Normal rate, regular rhythm and  normal heart sounds. Exam reveals no gallop and no friction rub.  No murmur heard. Pulmonary/Chest: Effort normal and breath sounds normal. He has no wheezes. He has no rales.  Abdominal: Soft.  Musculoskeletal:     Cervical back: Neck supple.  Neurological: He is alert and oriented to person, place, and time.  Skin: Skin is warm. No rash noted.  Psychiatric: He has a normal mood and affect. His behavior is normal.  Nursing note and vitals reviewed.  Previous notes and tests were reviewed. The plan was reviewed with the patient/family, and all questions/concerned were addressed.  It was my pleasure to see Kevin Graham today and participate in his care. Please feel free to contact me with any questions or concerns.  Sincerely,  Rexene Alberts, DO Allergy & Immunology  [facility] office: [phone] [facility] office: [place] office: [phone]

## 2019-10-28 ENCOUNTER — Ambulatory Visit (INDEPENDENT_AMBULATORY_CARE_PROVIDER_SITE_OTHER): Payer: 59 | Admitting: *Deleted

## 2019-10-28 DIAGNOSIS — J309 Allergic rhinitis, unspecified: Secondary | ICD-10-CM

## 2019-11-24 ENCOUNTER — Ambulatory Visit (INDEPENDENT_AMBULATORY_CARE_PROVIDER_SITE_OTHER): Payer: 59

## 2019-11-24 DIAGNOSIS — J309 Allergic rhinitis, unspecified: Secondary | ICD-10-CM | POA: Diagnosis not present

## 2019-11-29 ENCOUNTER — Ambulatory Visit (INDEPENDENT_AMBULATORY_CARE_PROVIDER_SITE_OTHER): Payer: 59 | Admitting: *Deleted

## 2019-11-29 DIAGNOSIS — J309 Allergic rhinitis, unspecified: Secondary | ICD-10-CM | POA: Diagnosis not present

## 2019-12-09 ENCOUNTER — Ambulatory Visit: Payer: Self-pay | Attending: Internal Medicine

## 2019-12-09 DIAGNOSIS — Z23 Encounter for immunization: Secondary | ICD-10-CM

## 2019-12-09 NOTE — Progress Notes (Signed)
   Covid-19 Vaccination Clinic  Name:  Kevin Graham    MRN: [id number] DOB: [birth year]  12/09/2019  [name] was observed post Covid-19 immunization for 15 minutes without incident. He was provided with Vaccine Information Sheet and instruction to access the V-Safe system.   [name] was instructed to call 911 with any severe reactions post vaccine: Marland Kitchen Difficulty breathing  . Swelling of face and throat  . A fast heartbeat  . A bad rash all over body  . Dizziness and weakness   Immunizations Administered    Name Date Dose VIS Date Route   Moderna COVID-19 Vaccine 12/09/2019  9:35 AM 0.5 mL 08/30/2019 Intramuscular   Manufacturer: Moderna   Lot: [id number]   [id number]

## 2019-12-15 ENCOUNTER — Ambulatory Visit (INDEPENDENT_AMBULATORY_CARE_PROVIDER_SITE_OTHER): Payer: 59

## 2019-12-15 DIAGNOSIS — J309 Allergic rhinitis, unspecified: Secondary | ICD-10-CM

## 2019-12-21 ENCOUNTER — Ambulatory Visit (INDEPENDENT_AMBULATORY_CARE_PROVIDER_SITE_OTHER): Payer: 59

## 2019-12-21 DIAGNOSIS — J309 Allergic rhinitis, unspecified: Secondary | ICD-10-CM | POA: Diagnosis not present

## 2019-12-30 ENCOUNTER — Other Ambulatory Visit: Payer: Self-pay | Admitting: Internal Medicine

## 2019-12-30 DIAGNOSIS — E782 Mixed hyperlipidemia: Secondary | ICD-10-CM

## 2020-01-05 ENCOUNTER — Ambulatory Visit (INDEPENDENT_AMBULATORY_CARE_PROVIDER_SITE_OTHER): Payer: 59

## 2020-01-05 DIAGNOSIS — J309 Allergic rhinitis, unspecified: Secondary | ICD-10-CM | POA: Diagnosis not present

## 2020-01-10 ENCOUNTER — Ambulatory Visit: Payer: Self-pay | Attending: Internal Medicine

## 2020-01-10 DIAGNOSIS — Z23 Encounter for immunization: Secondary | ICD-10-CM

## 2020-01-10 NOTE — Progress Notes (Signed)
   Covid-19 Vaccination Clinic  Name:  Kevin Graham    MRN: [id number] DOB: 03-16-1971  01/10/2020  [name] was observed post Covid-19 immunization for 15 minutes without incident. He was provided with Vaccine Information Sheet and instruction to access the V-Safe system.   [name] was instructed to call 911 with any severe reactions post vaccine: Marland Kitchen Difficulty breathing  . Swelling of face and throat  . A fast heartbeat  . A bad rash all over body  . Dizziness and weakness   Immunizations Administered    Name Date Dose VIS Date Route   Moderna COVID-19 Vaccine 01/10/2020  9:12 AM 0.5 mL 08/30/2019 Intramuscular   Manufacturer: Moderna   Lot: [id number]   [id number]

## 2020-01-11 ENCOUNTER — Other Ambulatory Visit: Payer: Self-pay | Admitting: Internal Medicine

## 2020-01-11 DIAGNOSIS — E782 Mixed hyperlipidemia: Secondary | ICD-10-CM

## 2020-01-13 ENCOUNTER — Ambulatory Visit (INDEPENDENT_AMBULATORY_CARE_PROVIDER_SITE_OTHER): Payer: 59

## 2020-01-13 DIAGNOSIS — J309 Allergic rhinitis, unspecified: Secondary | ICD-10-CM | POA: Diagnosis not present

## 2020-01-27 ENCOUNTER — Ambulatory Visit (INDEPENDENT_AMBULATORY_CARE_PROVIDER_SITE_OTHER): Payer: 59

## 2020-01-27 DIAGNOSIS — J309 Allergic rhinitis, unspecified: Secondary | ICD-10-CM

## 2020-01-29 ENCOUNTER — Other Ambulatory Visit: Payer: Self-pay | Admitting: Internal Medicine

## 2020-01-29 DIAGNOSIS — E782 Mixed hyperlipidemia: Secondary | ICD-10-CM

## 2020-02-06 ENCOUNTER — Ambulatory Visit (INDEPENDENT_AMBULATORY_CARE_PROVIDER_SITE_OTHER): Payer: 59

## 2020-02-06 DIAGNOSIS — J309 Allergic rhinitis, unspecified: Secondary | ICD-10-CM | POA: Diagnosis not present

## 2020-02-16 ENCOUNTER — Other Ambulatory Visit: Payer: Self-pay

## 2020-02-16 ENCOUNTER — Other Ambulatory Visit (INDEPENDENT_AMBULATORY_CARE_PROVIDER_SITE_OTHER): Payer: 59

## 2020-02-16 DIAGNOSIS — E782 Mixed hyperlipidemia: Secondary | ICD-10-CM

## 2020-02-16 LAB — LIPID PANEL
Cholesterol: 209 mg/dL — ABNORMAL HIGH (ref 0–200)
HDL: 53.6 mg/dL (ref >39.00)
NonHDL: 154.91
Total CHOL/HDL Ratio: 4
Triglycerides: 209 mg/dL — ABNORMAL HIGH (ref 0.0–149.0)
VLDL: 41.8 mg/dL — ABNORMAL HIGH (ref 0.0–40.0)

## 2020-02-16 LAB — LDL CHOLESTEROL, DIRECT: Direct LDL: 118 mg/dL

## 2020-02-18 ENCOUNTER — Other Ambulatory Visit: Payer: Self-pay | Admitting: Internal Medicine

## 2020-02-28 ENCOUNTER — Other Ambulatory Visit: Payer: Self-pay | Admitting: Internal Medicine

## 2020-02-28 DIAGNOSIS — E782 Mixed hyperlipidemia: Secondary | ICD-10-CM

## 2020-03-02 ENCOUNTER — Ambulatory Visit (INDEPENDENT_AMBULATORY_CARE_PROVIDER_SITE_OTHER): Payer: 59

## 2020-03-02 DIAGNOSIS — J309 Allergic rhinitis, unspecified: Secondary | ICD-10-CM | POA: Diagnosis not present

## 2020-03-08 ENCOUNTER — Ambulatory Visit (INDEPENDENT_AMBULATORY_CARE_PROVIDER_SITE_OTHER): Payer: 59

## 2020-03-08 DIAGNOSIS — J309 Allergic rhinitis, unspecified: Secondary | ICD-10-CM | POA: Diagnosis not present

## 2020-03-29 ENCOUNTER — Other Ambulatory Visit: Payer: Self-pay | Admitting: Internal Medicine

## 2020-03-29 DIAGNOSIS — E782 Mixed hyperlipidemia: Secondary | ICD-10-CM

## 2020-04-18 ENCOUNTER — Ambulatory Visit: Payer: 59 | Admitting: Allergy

## 2020-04-18 NOTE — Progress Notes (Deleted)
Follow Up Note  RE: Kevin Graham MRN: [id number] DOB: [birth year] Date of Office Visit: 04/18/2020  Referring provider: Jearld Fenton, NP Primary care provider: Jearld Fenton, NP  Chief Complaint: No chief complaint on file.  History of Present Illness: I had the pleasure of seeing Kevin Graham for a follow up visit at the [facility] [place] on 04/18/2020. He is a 49 y.o. male, who is being followed for allergic rhinoconjunctivitis on AIT and coughing. His previous allergy office visit was on 10/17/2019 with Dr. Maudie Mercury. Today is a regular follow up visit.  Seasonal and perennial allergic rhinoconjunctivitis Past history - Perennial rhino conjunctivitis symptoms for the past 5+ years with worsening in the spring. Patient was receiving allergy injections weekly for about 5 years with good benefit at [facility] allergy. He needs to establish care with new allergist due to change in his insurance. Started AIT on 10/28/2018 (Mold-Dmite-Cat-Cockroach & Tree-Grass-Ragweed-Weed) Interim history - injections seem to be helping but sometimes finds it difficult to come to our office for the weekly shots.  Continue allergy injections.  Ask PCP if they are willing to give the shots at their office as it's closer to where he lives.   May use over the counter antihistamines such as Zyrtec (cetirizine) daily as needed.  May use Flonase 1 spray per nostril twice a day as needed for stuffy nose.   May use azelastine eye drops 1 drop in each eye twice a day as needed for itchy eyes NOT dry eyes.  Get over the counter refresh eye drops for the dry eyes.  Get your eyes checked!  Coughing Past history - Complaining of some coughing the last few months. No other symptoms. He does smoke and has been doing so for over 30 years. 2020 spirometry showed: possible restrictive disease. Interim history - mild coughing in the morning at times which resolve after drinking water.  Monitor  symptoms.  Decrease smoking.    Assessment and Plan: Kevin Graham is a 49 y.o. male with: No problem-specific Assessment & Plan notes found for this encounter.  No follow-ups on file.  No orders of the defined types were placed in this encounter.  Lab Orders  No laboratory test(s) ordered today    Diagnostics: Spirometry:  Tracings reviewed. His effort: {Blank single:19197::"Good reproducible efforts.","It was hard to get consistent efforts and there is a question as to whether this reflects a maximal maneuver.","Poor effort, data can not be interpreted."} FVC: ***L FEV1: ***L, ***% predicted FEV1/FVC ratio: ***% Interpretation: {Blank single:19197::"Spirometry consistent with mild obstructive disease","Spirometry consistent with moderate obstructive disease","Spirometry consistent with severe obstructive disease","Spirometry consistent with possible restrictive disease","Spirometry consistent with mixed obstructive and restrictive disease","Spirometry uninterpretable due to technique","Spirometry consistent with normal pattern","No overt abnormalities noted given today's efforts"}.  Please see scanned spirometry results for details.  Skin Testing: {Blank single:19197::"Select foods","Environmental allergy panel","Environmental allergy panel and select foods","Food allergy panel","None","Deferred due to recent antihistamines use"}. Positive test to: ***. Negative test to: ***.  Results discussed with patient/family.   Medication List:  Current Outpatient Medications  Medication Sig Dispense Refill  . azelastine (OPTIVAR) 0.05 % ophthalmic solution Place 1 drop into both eyes daily as needed (for itchy/watery eyes). As needed 6 mL 5  . cetirizine (ZYRTEC) 10 MG tablet Take 1 tablet (10 mg total) by mouth daily as needed for allergies or rhinitis. 30 tablet 5  . EPINEPHrine (EPIPEN 2-PAK) 0.3 mg/0.3 mL IJ SOAJ injection Inject 0.3 mg into the muscle once.    Marland Kitchen  ezetimibe (ZETIA) 10 MG  tablet TAKE 1 TABLET(10 MG) BY MOUTH DAILY 30 tablet 1  . famotidine (PEPCID) 10 MG tablet Take 10 mg by mouth 2 (two) times daily.    . fluticasone (FLONASE) 50 MCG/ACT nasal spray Place 1 spray into both nostrils 2 (two) times daily as needed. 16 g 5  . gabapentin (NEURONTIN) 100 MG capsule Take 100 mg twice a day for one week, then increase to 200 mg(2 tablets) twice a day and continue    . naproxen sodium (ALEVE) 220 MG tablet Take 220 mg by mouth as needed.    . pantoprazole (PROTONIX) 40 MG tablet TAKE 1 TABLET BY MOUTH EVERY DAY 30 tablet 6  . simvastatin (ZOCOR) 40 MG tablet TAKE 1 TABLET BY MOUTH AT BEDTIME 30 tablet 1   No current facility-administered medications for this visit.   Allergies: No Known Allergies I reviewed his past medical history, social history, family history, and environmental history and no significant changes have been reported from his previous visit.  Review of Systems  Constitutional: Negative for appetite change, chills, fever and unexpected weight change.  HENT: Positive for congestion. Negative for rhinorrhea.   Eyes: Negative for itching.  Respiratory: Positive for cough. Negative for chest tightness, shortness of breath and wheezing.   Cardiovascular: Negative for chest pain.  Gastrointestinal: Negative for abdominal pain.  Genitourinary: Negative for difficulty urinating.  Skin: Negative for rash.  Allergic/Immunologic: Positive for environmental allergies. Negative for food allergies.  Neurological: Negative for headaches.   Objective: There were no vitals taken for this visit. There is no height or weight on file to calculate BMI. Physical Exam Vitals and nursing note reviewed.  Constitutional:      Appearance: He is well-developed.  HENT:     Head: Normocephalic and atraumatic.     Right Ear: External ear normal.     Left Ear: External ear normal.     Nose: Nose normal.  Eyes:     Conjunctiva/sclera: Conjunctivae normal.   Cardiovascular:     Rate and Rhythm: Normal rate and regular rhythm.     Heart sounds: Normal heart sounds. No murmur heard.  No friction rub. No gallop.   Pulmonary:     Effort: Pulmonary effort is normal.     Breath sounds: Normal breath sounds. No wheezing or rales.  Abdominal:     Palpations: Abdomen is soft.  Musculoskeletal:     Cervical back: Neck supple.  Skin:    General: Skin is warm.     Findings: No rash.  Neurological:     Mental Status: He is alert and oriented to person, place, and time.  Psychiatric:        Behavior: Behavior normal.    Previous notes and tests were reviewed. The plan was reviewed with the patient/family, and all questions/concerned were addressed.  It was my pleasure to see Kevin Graham today and participate in his care. Please feel free to contact me with any questions or concerns.  Sincerely,  Rexene Alberts, DO Allergy & Immunology  [facility] office: [phone] [facility] [address] [facility] office: [place] office: [phone]

## 2020-04-28 ENCOUNTER — Other Ambulatory Visit: Payer: Self-pay | Admitting: Allergy

## 2020-05-17 ENCOUNTER — Other Ambulatory Visit: Payer: Self-pay | Admitting: Internal Medicine

## 2020-05-28 ENCOUNTER — Other Ambulatory Visit: Payer: Self-pay | Admitting: Internal Medicine

## 2020-05-28 ENCOUNTER — Other Ambulatory Visit: Payer: Self-pay | Admitting: Allergy

## 2020-05-28 DIAGNOSIS — E782 Mixed hyperlipidemia: Secondary | ICD-10-CM

## 2020-06-15 ENCOUNTER — Ambulatory Visit (INDEPENDENT_AMBULATORY_CARE_PROVIDER_SITE_OTHER): Payer: 59 | Admitting: *Deleted

## 2020-06-15 DIAGNOSIS — J309 Allergic rhinitis, unspecified: Secondary | ICD-10-CM

## 2020-06-21 ENCOUNTER — Other Ambulatory Visit: Payer: Self-pay | Admitting: Allergy

## 2020-06-21 ENCOUNTER — Other Ambulatory Visit: Payer: Self-pay

## 2020-06-21 MED ORDER — CETIRIZINE HCL 10 MG PO TABS: 10.0000 mg | ORAL_TABLET | Freq: Every day | ORAL | 1 refills | Status: DC | PRN

## 2020-06-22 ENCOUNTER — Ambulatory Visit (INDEPENDENT_AMBULATORY_CARE_PROVIDER_SITE_OTHER): Payer: 59

## 2020-06-22 DIAGNOSIS — J309 Allergic rhinitis, unspecified: Secondary | ICD-10-CM | POA: Diagnosis not present

## 2020-06-26 ENCOUNTER — Ambulatory Visit (INDEPENDENT_AMBULATORY_CARE_PROVIDER_SITE_OTHER): Payer: 59 | Admitting: *Deleted

## 2020-06-26 DIAGNOSIS — J309 Allergic rhinitis, unspecified: Secondary | ICD-10-CM

## 2020-07-10 ENCOUNTER — Ambulatory Visit (INDEPENDENT_AMBULATORY_CARE_PROVIDER_SITE_OTHER): Payer: 59

## 2020-07-10 DIAGNOSIS — J309 Allergic rhinitis, unspecified: Secondary | ICD-10-CM

## 2020-07-19 ENCOUNTER — Other Ambulatory Visit: Payer: Self-pay | Admitting: Internal Medicine

## 2020-07-30 DIAGNOSIS — J302 Other seasonal allergic rhinitis: Secondary | ICD-10-CM

## 2020-07-30 NOTE — Progress Notes (Signed)
VIALS EXP 07-30-21 °

## 2020-07-31 DIAGNOSIS — J3089 Other allergic rhinitis: Secondary | ICD-10-CM

## 2020-08-13 ENCOUNTER — Ambulatory Visit (INDEPENDENT_AMBULATORY_CARE_PROVIDER_SITE_OTHER): Payer: 59 | Admitting: *Deleted

## 2020-08-13 DIAGNOSIS — J309 Allergic rhinitis, unspecified: Secondary | ICD-10-CM

## 2020-08-17 ENCOUNTER — Ambulatory Visit (INDEPENDENT_AMBULATORY_CARE_PROVIDER_SITE_OTHER): Payer: 59 | Admitting: Internal Medicine

## 2020-08-17 ENCOUNTER — Other Ambulatory Visit: Payer: Self-pay

## 2020-08-17 ENCOUNTER — Encounter: Payer: Self-pay | Admitting: Internal Medicine

## 2020-08-17 VITALS — BP 130/84 | HR 68 | Temp 98.1°F | Ht 62.25 in | Wt 156.0 lb

## 2020-08-17 DIAGNOSIS — Z23 Encounter for immunization: Secondary | ICD-10-CM | POA: Diagnosis not present

## 2020-08-17 DIAGNOSIS — Z0000 Encounter for general adult medical examination without abnormal findings: Secondary | ICD-10-CM | POA: Diagnosis not present

## 2020-08-17 DIAGNOSIS — K219 Gastro-esophageal reflux disease without esophagitis: Secondary | ICD-10-CM | POA: Diagnosis not present

## 2020-08-17 DIAGNOSIS — E782 Mixed hyperlipidemia: Secondary | ICD-10-CM

## 2020-08-17 DIAGNOSIS — R202 Paresthesia of skin: Secondary | ICD-10-CM | POA: Diagnosis not present

## 2020-08-17 DIAGNOSIS — Z0001 Encounter for general adult medical examination with abnormal findings: Secondary | ICD-10-CM

## 2020-08-17 NOTE — Progress Notes (Signed)
Subjective:    Patient ID: Kevin Graham, male    DOB: [birth year], 49 y.o.   MRN: [id number]  HPI  Patient presents the clinic today for his annual exam.  He is also due to follow-up chronic conditions.  GERD: He is not sure what triggers this.  He denies breakthrough on Pantoprazole and Famotidine.  There is no upper GI on file.  HLD: His last LDL was 118, triglycerides were 209, 01/2020.  He denies myalgias on Simvastatin.  He does not consume a low-fat diet.  Paresthesia of BUE/BLE: Managed on Gabapentin with good relief of symptoms.  He is not currently following with neurology.  Flu: 04/2019 Tetanus: 05/2016 Covid: Moderna Colon Screening: never Vision Screening: as needed Dentist: biannually  Diet: He does eat meat. He consumes fruits and veggies daily. He does eat fried foods. He drinks mostly water. Exercise: None  Review of Systems      Past Medical History:  Diagnosis Date  . Environmental allergies   . History of stomach ulcers   . Seasonal allergies     Current Outpatient Medications  Medication Sig Dispense Refill  . azelastine (OPTIVAR) 0.05 % ophthalmic solution Place 1 drop into both eyes daily as needed (for itchy/watery eyes). As needed 6 mL 5  . cetirizine (ZYRTEC) 10 MG tablet Take 1 tablet (10 mg total) by mouth daily as needed for allergies or rhinitis. 34 tablet 1  . EPINEPHrine (EPIPEN 2-PAK) 0.3 mg/0.3 mL IJ SOAJ injection Inject 0.3 mg into the muscle once.    . ezetimibe (ZETIA) 10 MG tablet TAKE 1 TABLET(10 MG) BY MOUTH DAILY 30 tablet 1  . famotidine (PEPCID) 10 MG tablet Take 10 mg by mouth 2 (two) times daily.    . fluticasone (FLONASE) 50 MCG/ACT nasal spray SHAKE LIQUID AND USE 1 SPRAY IN EACH NOSTRIL TWICE DAILY AS NEEDED 16 g 0  . gabapentin (NEURONTIN) 100 MG capsule Take 100 mg twice a day for one week, then increase to 200 mg(2 tablets) twice a day and continue    . naproxen sodium (ALEVE) 220 MG tablet Take 220 mg by mouth as needed.      . pantoprazole (PROTONIX) 40 MG tablet TAKE 1 TABLET BY MOUTH EVERY DAY 30 tablet 6  . simvastatin (ZOCOR) 40 MG tablet Take 1 tablet (40 mg total) by mouth at bedtime. SCHEDULE PHYSCIAL 30 tablet 1   No current facility-administered medications for this visit.    No Known Allergies  Family History  Problem Relation Age of Onset  . Hyperlipidemia Mother     Social History   Socioeconomic History  . Marital status: Single    Spouse name: Not on file  . Number of children: Not on file  . Years of education: Not on file  . Highest education level: Not on file  Occupational History  . Not on file  Tobacco Use  . Smoking status: Current Every Day Smoker    Packs/day: 0.25    Years: 38.00    Pack years: 9.50    Types: Cigarettes  . Smokeless tobacco: Never Used  . Tobacco comment: 2 packs per week  Vaping Use  . Vaping Use: Never used  Substance and Sexual Activity  . Alcohol use: Yes    Alcohol/week: 3.0 standard drinks    Types: 3 Cans of beer per week    Comment: 1-3 beers during the week, more on the weekends  . Drug use: No  . Sexual activity: Yes  Partners: Female  Other Topics Concern  . Not on file  Social History Narrative   Lives with girlfriend and her child in a one bedroom home.  Has 5 children.  Works at [employer].  Education: high school.   Social Determinants of Health   Financial Resource Strain:   . Difficulty of Paying Living Expenses: Not on file  Food Insecurity:   . Worried About Charity fundraiser in the Last Year: Not on file  . Ran Out of Food in the Last Year: Not on file  Transportation Needs:   . Lack of Transportation (Medical): Not on file  . Lack of Transportation (Non-Medical): Not on file  Physical Activity:   . Days of Exercise per Week: Not on file  . Minutes of Exercise per Session: Not on file  Stress:   . Feeling of Stress : Not on file  Social Connections:   . Frequency of Communication with Friends and Family: Not on  file  . Frequency of Social Gatherings with Friends and Family: Not on file  . Attends Religious Services: Not on file  . Active Member of Clubs or Organizations: Not on file  . Attends Archivist Meetings: Not on file  . Marital Status: Not on file  Intimate Partner Violence:   . Fear of Current or Ex-Partner: Not on file  . Emotionally Abused: Not on file  . Physically Abused: Not on file  . Sexually Abused: Not on file     Constitutional: Denies fever, malaise, fatigue, headache or abrupt weight changes.  HEENT: Denies eye pain, eye redness, ear pain, ringing in the ears, wax buildup, runny nose, nasal congestion, bloody nose, or sore throat. Respiratory: Denies difficulty breathing, shortness of breath, cough or sputum production.   Cardiovascular: Denies chest pain, chest tightness, palpitations or swelling in the hands or feet.  Gastrointestinal: Denies abdominal pain, bloating, constipation, diarrhea or blood in the stool.  GU: Denies urgency, frequency, pain with urination, burning sensation, blood in urine, odor or discharge. Musculoskeletal: Denies decrease in range of motion, difficulty with gait, muscle pain or joint pain and swelling.  Skin: Denies redness, rashes, lesions or ulcercations.  Neurological: Pt reports paresthesia of upper and lower extremities. Denies dizziness, difficulty with memory, difficulty with speech or problems with balance and coordination.  Psych: Denies anxiety, depression, SI/HI.  No other specific complaints in a complete review of systems (except as listed in HPI above).  Objective:   Physical Exam  BP 130/84   Pulse 68   Temp 98.1 F (36.7 C) (Temporal)   Ht 5' 2.25" (1.581 m)   Wt 156 lb (70.8 kg)   SpO2 98%   BMI 28.30 kg/m   Wt Readings from Last 3 Encounters:  10/17/19 165 lb (74.8 kg)  05/26/19 158 lb (71.7 kg)  10/14/18 158 lb (71.7 kg)    General: Appears his stated age, well developed, well nourished in  NAD. Skin: Warm, dry and intact. No rashes noted. HEENT: Head: normal shape and size; Eyes: sclera white, no icterus, conjunctiva pink, PERRLA and EOMs intact;  Neck:  Neck supple, trachea midline. No masses, lumps or thyromegaly present.  Cardiovascular: Normal rate and rhythm. S1,S2 noted.  No murmur, rubs or gallops noted. No JVD or BLE edema.  Pulmonary/Chest: Normal effort and positive vesicular breath sounds. No respiratory distress. No wheezes, rales or ronchi noted.  Abdomen: Soft and nontender. Normal bowel sounds. No distention or masses noted. Liver, spleen and kidneys non  palpable. Musculoskeletal: Strength 5/5 BUE/BLE. No difficulty with gait.  Neurological: Alert and oriented. Cranial nerves II-XII grossly intact. Coordination normal.  Psychiatric: Mood and affect normal. Behavior is normal. Judgment and thought content normal.     BMET    Component Value Date/Time   NA 139 05/26/2019 0837   K 4.4 05/26/2019 0837   CL 103 05/26/2019 0837   CO2 29 05/26/2019 0837   GLUCOSE 94 05/26/2019 0837   BUN 15 05/26/2019 0837   CREATININE 0.96 05/26/2019 0837   CALCIUM 10.6 (H) 05/26/2019 0837   GFRNONAA >60 04/06/2017 2018   GFRAA >60 04/06/2017 2018    Lipid Panel     Component Value Date/Time   CHOL 209 (H) 02/16/2020 0825   TRIG 209.0 (H) 02/16/2020 0825   HDL 53.60 02/16/2020 0825   CHOLHDL 4 02/16/2020 0825   VLDL 41.8 (H) 02/16/2020 0825   LDLCALC 149 (H) 10/02/2016 1129    CBC    Component Value Date/Time   WBC 5.6 05/26/2019 0837   RBC 4.83 05/26/2019 0837   HGB 15.5 05/26/2019 0837   HCT 46.0 05/26/2019 0837   PLT 165.0 05/26/2019 0837   MCV 95.4 05/26/2019 0837   MCH 33.1 04/06/2017 2018   MCHC 33.7 05/26/2019 0837   RDW 13.2 05/26/2019 0837    Hgb A1C Lab Results  Component Value Date   HGBA1C 5.8 01/16/2017            Assessment & Plan:   Preventative Health Maintenance:  Flu shot today Tetanus UTD Covid UTD He declines referral  for colon screening Encouraged him to consume a balanced diet and exercise regimen Advised him to see an eye doctor and dentist annually Will check CBC, CMET and Lipid profile today  RTC in 1 year, sooner if needed Webb Silversmith, NP This visit occurred during the SARS-CoV-2 public health emergency.  Safety protocols were in place, including screening questions prior to the visit, additional usage of staff PPE, and extensive cleaning of exam room while observing appropriate contact time as indicated for disinfecting solutions.

## 2020-08-18 LAB — COMPREHENSIVE METABOLIC PANEL
AG Ratio: 2 (calc) (ref 1.0–2.5)
ALT: 34 U/L (ref 9–46)
AST: 21 U/L (ref 10–40)
Albumin: 4.7 g/dL (ref 3.6–5.1)
Alkaline phosphatase (APISO): 57 U/L (ref 36–130)
BUN: 14 mg/dL (ref 7–25)
CO2: 24 mmol/L (ref 20–32)
Calcium: 10.2 mg/dL (ref 8.6–10.3)
Chloride: 104 mmol/L (ref 98–110)
Creat: 0.96 mg/dL (ref 0.60–1.35)
Globulin: 2.3 g/dL (calc) (ref 1.9–3.7)
Glucose, Bld: 88 mg/dL (ref 65–99)
Potassium: 4.1 mmol/L (ref 3.5–5.3)
Sodium: 138 mmol/L (ref 135–146)
Total Bilirubin: 0.5 mg/dL (ref 0.2–1.2)
Total Protein: 7 g/dL (ref 6.1–8.1)

## 2020-08-18 LAB — LIPID PANEL
Cholesterol: 165 mg/dL (ref <200)
HDL: 52 mg/dL (ref >=40)
LDL Cholesterol (Calc): 86 mg/dL (calc)
Non-HDL Cholesterol (Calc): 113 mg/dL (calc) (ref <130)
Total CHOL/HDL Ratio: 3.2 (calc) (ref <5.0)
Triglycerides: 168 mg/dL — ABNORMAL HIGH (ref <150)

## 2020-08-18 LAB — CBC
HCT: 43.9 % (ref 38.5–50.0)
Hemoglobin: 15 g/dL (ref 13.2–17.1)
MCH: 32.2 pg (ref 27.0–33.0)
MCHC: 34.2 g/dL (ref 32.0–36.0)
MCV: 94.2 fL (ref 80.0–100.0)
MPV: 10.1 fL (ref 7.5–12.5)
Platelets: 159 10*3/uL (ref 140–400)
RBC: 4.66 10*6/uL (ref 4.20–5.80)
RDW: 12.5 % (ref 11.0–15.0)
WBC: 6.3 10*3/uL (ref 3.8–10.8)

## 2020-08-19 ENCOUNTER — Encounter: Payer: Self-pay | Admitting: Internal Medicine

## 2020-08-19 NOTE — Assessment & Plan Note (Signed)
CMET and lipid profile today Continue Simvastatin Encouraged him to consume a low fat diet

## 2020-08-19 NOTE — Assessment & Plan Note (Signed)
CBC and CMET today Continue Pantoprazole and Famotidine

## 2020-08-19 NOTE — Assessment & Plan Note (Signed)
Continue Gabapentin Will monitor

## 2020-08-19 NOTE — Patient Instructions (Signed)

## 2020-08-26 ENCOUNTER — Other Ambulatory Visit: Payer: Self-pay | Admitting: Internal Medicine

## 2020-08-26 DIAGNOSIS — E782 Mixed hyperlipidemia: Secondary | ICD-10-CM

## 2020-09-25 ENCOUNTER — Other Ambulatory Visit: Payer: Self-pay | Admitting: Internal Medicine

## 2020-12-03 ENCOUNTER — Ambulatory Visit (INDEPENDENT_AMBULATORY_CARE_PROVIDER_SITE_OTHER): Payer: 59 | Admitting: *Deleted

## 2020-12-03 DIAGNOSIS — J309 Allergic rhinitis, unspecified: Secondary | ICD-10-CM

## 2020-12-04 ENCOUNTER — Other Ambulatory Visit: Payer: Self-pay | Admitting: Allergy

## 2020-12-11 ENCOUNTER — Ambulatory Visit (INDEPENDENT_AMBULATORY_CARE_PROVIDER_SITE_OTHER): Payer: 59 | Admitting: *Deleted

## 2020-12-11 DIAGNOSIS — J309 Allergic rhinitis, unspecified: Secondary | ICD-10-CM

## 2020-12-17 ENCOUNTER — Ambulatory Visit (INDEPENDENT_AMBULATORY_CARE_PROVIDER_SITE_OTHER): Payer: 59 | Admitting: *Deleted

## 2020-12-17 DIAGNOSIS — J309 Allergic rhinitis, unspecified: Secondary | ICD-10-CM | POA: Diagnosis not present

## 2020-12-24 ENCOUNTER — Ambulatory Visit (INDEPENDENT_AMBULATORY_CARE_PROVIDER_SITE_OTHER): Payer: 59

## 2020-12-24 DIAGNOSIS — J309 Allergic rhinitis, unspecified: Secondary | ICD-10-CM | POA: Diagnosis not present

## 2021-01-01 ENCOUNTER — Ambulatory Visit (INDEPENDENT_AMBULATORY_CARE_PROVIDER_SITE_OTHER): Payer: 59 | Admitting: *Deleted

## 2021-01-01 DIAGNOSIS — J309 Allergic rhinitis, unspecified: Secondary | ICD-10-CM

## 2021-01-07 ENCOUNTER — Ambulatory Visit (INDEPENDENT_AMBULATORY_CARE_PROVIDER_SITE_OTHER): Payer: 59

## 2021-01-07 DIAGNOSIS — J309 Allergic rhinitis, unspecified: Secondary | ICD-10-CM

## 2021-01-17 ENCOUNTER — Ambulatory Visit (INDEPENDENT_AMBULATORY_CARE_PROVIDER_SITE_OTHER): Payer: 59 | Admitting: *Deleted

## 2021-01-17 DIAGNOSIS — J309 Allergic rhinitis, unspecified: Secondary | ICD-10-CM

## 2021-01-19 ENCOUNTER — Other Ambulatory Visit: Payer: Self-pay | Admitting: Allergy

## 2021-01-29 ENCOUNTER — Ambulatory Visit (INDEPENDENT_AMBULATORY_CARE_PROVIDER_SITE_OTHER): Payer: 59 | Admitting: *Deleted

## 2021-01-29 DIAGNOSIS — J309 Allergic rhinitis, unspecified: Secondary | ICD-10-CM | POA: Diagnosis not present

## 2021-02-18 ENCOUNTER — Other Ambulatory Visit: Payer: Self-pay | Admitting: Internal Medicine

## 2021-02-27 NOTE — Telephone Encounter (Signed)
Refill request Zetia Last office visit 08/17/20 Last refill 07/20/20 #30/1 No upcoming appointment scheduled

## 2021-04-11 ENCOUNTER — Encounter: Payer: 59 | Admitting: Adult Health

## 2021-06-06 ENCOUNTER — Encounter: Payer: 59 | Admitting: Adult Health

## 2021-06-24 ENCOUNTER — Encounter: Payer: 59 | Admitting: Adult Health

## 2021-07-09 ENCOUNTER — Other Ambulatory Visit: Payer: Self-pay

## 2021-07-09 ENCOUNTER — Other Ambulatory Visit: Payer: Self-pay | Admitting: Internal Medicine

## 2021-07-09 DIAGNOSIS — Z0000 Encounter for general adult medical examination without abnormal findings: Secondary | ICD-10-CM

## 2021-07-10 LAB — CBC
HCT: 46.2 % (ref 38.5–50.0)
Hemoglobin: 15.5 g/dL (ref 13.2–17.1)
MCH: 32.6 pg (ref 27.0–33.0)
MCHC: 33.5 g/dL (ref 32.0–36.0)
MCV: 97.1 fL (ref 80.0–100.0)
MPV: 10 fL (ref 7.5–12.5)
Platelets: 155 10*3/uL (ref 140–400)
RBC: 4.76 10*6/uL (ref 4.20–5.80)
RDW: 12.4 % (ref 11.0–15.0)
WBC: 4.8 10*3/uL (ref 3.8–10.8)

## 2021-07-10 LAB — LIPID PANEL
Cholesterol: 191 mg/dL (ref <200)
HDL: 49 mg/dL (ref >=40)
LDL Cholesterol (Calc): 100 mg/dL (calc) — ABNORMAL HIGH
Non-HDL Cholesterol (Calc): 142 mg/dL (calc) — ABNORMAL HIGH (ref <130)
Total CHOL/HDL Ratio: 3.9 (calc) (ref <5.0)
Triglycerides: 292 mg/dL — ABNORMAL HIGH (ref <150)

## 2021-07-10 LAB — COMPLETE METABOLIC PANEL WITH GFR
AG Ratio: 2.1 (calc) (ref 1.0–2.5)
ALT: 37 U/L (ref 9–46)
AST: 21 U/L (ref 10–35)
Albumin: 4.6 g/dL (ref 3.6–5.1)
Alkaline phosphatase (APISO): 47 U/L (ref 35–144)
BUN: 14 mg/dL (ref 7–25)
CO2: 26 mmol/L (ref 20–32)
Calcium: 10.4 mg/dL — ABNORMAL HIGH (ref 8.6–10.3)
Chloride: 106 mmol/L (ref 98–110)
Creat: 0.96 mg/dL (ref 0.70–1.30)
Globulin: 2.2 g/dL (calc) (ref 1.9–3.7)
Glucose, Bld: 102 mg/dL — ABNORMAL HIGH (ref 65–99)
Potassium: 4.3 mmol/L (ref 3.5–5.3)
Sodium: 140 mmol/L (ref 135–146)
Total Bilirubin: 0.6 mg/dL (ref 0.2–1.2)
Total Protein: 6.8 g/dL (ref 6.1–8.1)
eGFR: 96 mL/min/{1.73_m2} (ref >=60)

## 2021-07-10 LAB — PSA: PSA: 2.38 ng/mL (ref <=4.00)

## 2021-07-10 LAB — HEMOGLOBIN A1C
Hgb A1c MFr Bld: 5.5 % of total Hgb (ref <5.7)
Mean Plasma Glucose: 111 mg/dL
eAG (mmol/L): 6.2 mmol/L

## 2021-07-11 ENCOUNTER — Other Ambulatory Visit: Payer: Self-pay

## 2021-07-11 ENCOUNTER — Ambulatory Visit (INDEPENDENT_AMBULATORY_CARE_PROVIDER_SITE_OTHER): Payer: 59 | Admitting: Internal Medicine

## 2021-07-11 ENCOUNTER — Encounter: Payer: Self-pay | Admitting: Internal Medicine

## 2021-07-11 VITALS — BP 123/77 | HR 77 | Temp 98.0°F | Resp 17 | Ht 62.25 in | Wt 160.2 lb

## 2021-07-11 DIAGNOSIS — I700 Atherosclerosis of aorta: Secondary | ICD-10-CM

## 2021-07-11 DIAGNOSIS — Z23 Encounter for immunization: Secondary | ICD-10-CM | POA: Diagnosis not present

## 2021-07-11 DIAGNOSIS — Z6829 Body mass index (BMI) 29.0-29.9, adult: Secondary | ICD-10-CM

## 2021-07-11 DIAGNOSIS — E782 Mixed hyperlipidemia: Secondary | ICD-10-CM

## 2021-07-11 DIAGNOSIS — K219 Gastro-esophageal reflux disease without esophagitis: Secondary | ICD-10-CM | POA: Diagnosis not present

## 2021-07-11 DIAGNOSIS — Z6828 Body mass index (BMI) 28.0-28.9, adult: Secondary | ICD-10-CM | POA: Insufficient documentation

## 2021-07-11 DIAGNOSIS — E663 Overweight: Secondary | ICD-10-CM | POA: Insufficient documentation

## 2021-07-11 DIAGNOSIS — R202 Paresthesia of skin: Secondary | ICD-10-CM

## 2021-07-11 MED ORDER — FAMOTIDINE 10 MG PO TABS: 10.0000 mg | ORAL_TABLET | Freq: Two times a day (BID) | ORAL | 1 refills | Status: DC

## 2021-07-11 MED ORDER — SIMVASTATIN 40 MG PO TABS: 40.0000 mg | ORAL_TABLET | Freq: Every day | ORAL | 1 refills | Status: DC

## 2021-07-11 MED ORDER — EZETIMIBE 10 MG PO TABS: ORAL_TABLET | ORAL | 1 refills | Status: DC

## 2021-07-11 NOTE — Assessment & Plan Note (Signed)
Encouraged him to consume a low-fat diet and increase aerobic exercise Continue Simvastatin and Ezetimibe

## 2021-07-11 NOTE — Assessment & Plan Note (Signed)
Encourage diet and exercise for weight loss 

## 2021-07-11 NOTE — Patient Instructions (Signed)
Heart-Healthy Eating Plan Heart-healthy meal planning includes: Eating less unhealthy fats. Eating more healthy fats. Making other changes in your diet. Talk with your doctor or a diet specialist (dietitian) to create an eating plan that is right for you. What is my plan? Your doctor may recommend an eating plan that includes: Total fat: ______% or less of total calories a day. Saturated fat: ______% or less of total calories a day. Cholesterol: less than _________mg a day. What are tips for following this plan? Cooking Avoid frying your food. Try to bake, boil, grill, or broil it instead. You can also reduce fat by: Removing the skin from poultry. Removing all visible fats from meats. Steaming vegetables in water or broth. Meal planning  At meals, divide your plate into four equal parts: Fill one-half of your plate with vegetables and green salads. Fill one-fourth of your plate with whole grains. Fill one-fourth of your plate with lean protein foods. Eat 4-5 servings of vegetables per day. A serving of vegetables is: 1 cup of raw or cooked vegetables. 2 cups of raw leafy greens. Eat 4-5 servings of fruit per day. A serving of fruit is: 1 medium whole fruit.  cup of dried fruit.  cup of fresh, frozen, or canned fruit.  cup of 100% fruit juice. Eat more foods that have soluble fiber. These are apples, broccoli, carrots, beans, peas, and barley. Try to get 20-30 g of fiber per day. Eat 4-5 servings of nuts, legumes, and seeds per week: 1 serving of dried beans or legumes equals  cup after being cooked. 1 serving of nuts is  cup. 1 serving of seeds equals 1 tablespoon. General information Eat more home-cooked food. Eat less restaurant, buffet, and fast food. Limit or avoid alcohol. Limit foods that are high in starch and sugar. Avoid fried foods. Lose weight if you are overweight. Keep track of how much salt (sodium) you eat. This is important if you have high blood  pressure. Ask your doctor to tell you more about this. Try to add vegetarian meals each week. Fats Choose healthy fats. These include olive oil and canola oil, flaxseeds, walnuts, almonds, and seeds. Eat more omega-3 fats. These include salmon, mackerel, sardines, tuna, flaxseed oil, and ground flaxseeds. Try to eat fish at least 2 times each week. Check food labels. Avoid foods with trans fats or high amounts of saturated fat. Limit saturated fats. These are often found in animal products, such as meats, butter, and cream. These are also found in plant foods, such as palm oil, palm kernel oil, and coconut oil. Avoid foods with partially hydrogenated oils in them. These have trans fats. Examples are stick margarine, some tub margarines, cookies, crackers, and other baked goods. What foods can I eat? Fruits All fresh, canned (in natural juice), or frozen fruits. Vegetables Fresh or frozen vegetables (raw, steamed, roasted, or grilled). Green salads. Grains Most grains. Choose whole wheat and whole grains most of the time. Rice and pasta, including brown rice and pastas made with whole wheat. Meats and other proteins Lean, well-trimmed beef, veal, pork, and lamb. Chicken and turkey without skin. All fish and shellfish. Wild duck, rabbit, pheasant, and venison. Egg whites or low-cholesterol egg substitutes. Dried beans, peas, lentils, and tofu. Seeds and most nuts. Dairy Low-fat or nonfat cheeses, including ricotta and mozzarella. Skim or 1% milk that is liquid, powdered, or evaporated. Buttermilk that is made with low-fat milk. Nonfat or low-fat yogurt. Fats and oils Non-hydrogenated (trans-free) margarines. Vegetable oils, including   soybean, sesame, sunflower, olive, peanut, safflower, corn, canola, and cottonseed. Salad dressings or mayonnaise made with a vegetable oil. Beverages Mineral water. Coffee and tea. Diet carbonated beverages. Sweets and desserts Sherbet, gelatin, and fruit ice.  Small amounts of dark chocolate. Limit all sweets and desserts. Seasonings and condiments All seasonings and condiments. The items listed above may not be a complete list of foods and drinks you can eat. Contact a dietitian for more options. What foods should I avoid? Fruits Canned fruit in heavy syrup. Fruit in cream or butter sauce. Fried fruit. Limit coconut. Vegetables Vegetables cooked in cheese, cream, or butter sauce. Fried vegetables. Grains Breads that are made with saturated or trans fats, oils, or whole milk. Croissants. Sweet rolls. Donuts. High-fat crackers, such as cheese crackers. Meats and other proteins Fatty meats, such as hot dogs, ribs, sausage, bacon, rib-eye roast or steak. High-fat deli meats, such as salami and bologna. Caviar. Domestic duck and goose. Organ meats, such as liver. Dairy Cream, sour cream, cream cheese, and creamed cottage cheese. Whole-milk cheeses. Whole or 2% milk that is liquid, evaporated, or condensed. Whole buttermilk. Cream sauce or high-fat cheese sauce. Yogurt that is made from whole milk. Fats and oils Meat fat, or shortening. Cocoa butter, hydrogenated oils, palm oil, coconut oil, palm kernel oil. Solid fats and shortenings, including bacon fat, salt pork, lard, and butter. Nondairy cream substitutes. Salad dressings with cheese or sour cream. Beverages Regular sodas and juice drinks with added sugar. Sweets and desserts Frosting. Pudding. Cookies. Cakes. Pies. Milk chocolate or white chocolate. Buttered syrups. Full-fat ice cream or ice cream drinks. The items listed above may not be a complete list of foods and drinks to avoid. Contact a dietitian for more information. Summary Heart-healthy meal planning includes eating less unhealthy fats, eating more healthy fats, and making other changes in your diet. Eat a balanced diet. This includes fruits and vegetables, low-fat or nonfat dairy, lean protein, nuts and legumes, whole grains, and  heart-healthy oils and fats. This information is not intended to replace advice given to you by your health care provider. Make sure you discuss any questions you have with your health care provider. Document Revised: 01/24/2021 Document Reviewed: 01/24/2021 Elsevier Patient Education  2022 [employer]  

## 2021-07-11 NOTE — Assessment & Plan Note (Signed)
Continue Pantoprazole and Famotidine CBC and c-Met reviewed

## 2021-07-11 NOTE — Progress Notes (Signed)
Subjective:    Patient ID: Kevin Graham, male    DOB: 04-22-1971, 50 y.o.   MRN: [id number]  HPI  Patient presents to clinic today for follow-up of chronic conditions.  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  He denies breakthrough on Pantoprazole and Famotidine.  There is no upper GI on file.  HLD with Aortic Atherosclerosis: His last LDL was 100, triglycerides 292, 06/2021.  He denies myalgias on Simvastatin but not Ezetimibe.  He does not consume a low-fat diet.  Paresthesia of Upper Extremities: Resolved, no longer on Gabapentin.  He has seen neurology in the past for the same but is not currently following with them.  Review of Systems     Past Medical History:  Diagnosis Date   Environmental allergies    History of stomach ulcers    Seasonal allergies     Current Outpatient Medications  Medication Sig Dispense Refill   azelastine (OPTIVAR) 0.05 % ophthalmic solution Place 1 drop into both eyes daily as needed (for itchy/watery eyes). As needed 6 mL 5   cetirizine (ZYRTEC) 10 MG tablet Take 1 tablet (10 mg total) by mouth daily as needed for allergies or rhinitis. 34 tablet 1   EPINEPHrine (EPIPEN 2-PAK) 0.3 mg/0.3 mL IJ SOAJ injection Inject 0.3 mg into the muscle once.     ezetimibe (ZETIA) 10 MG tablet TAKE 1 TABLET(10 MG) BY MOUTH DAILY 30 tablet 1   famotidine (PEPCID) 10 MG tablet Take 10 mg by mouth 2 (two) times daily.     fluticasone (FLONASE) 50 MCG/ACT nasal spray SHAKE LIQUID AND USE 1 SPRAY IN EACH NOSTRIL TWICE DAILY AS NEEDED 16 g 0   gabapentin (NEURONTIN) 100 MG capsule Take 100 mg twice a day for one week, then increase to 200 mg(2 tablets) twice a day and continue     naproxen sodium (ALEVE) 220 MG tablet Take 220 mg by mouth as needed.     pantoprazole (PROTONIX) 40 MG tablet TAKE 1 TABLET BY MOUTH EVERY DAY 30 tablet 6   simvastatin (ZOCOR) 40 MG tablet Take 1 tablet (40 mg total) by mouth daily at 6 PM. 90 tablet 2   No current facility-administered  medications for this visit.    No Known Allergies  Family History  Problem Relation Age of Onset   Hyperlipidemia Mother     Social History   Socioeconomic History   Marital status: Single    Spouse name: Not on file   Number of children: Not on file   Years of education: Not on file   Highest education level: Not on file  Occupational History   Not on file  Tobacco Use   Smoking status: Every Day    Packs/day: 0.25    Years: 38.00    Pack years: 9.50    Types: Cigarettes   Smokeless tobacco: Never   Tobacco comments:    2 packs per week  Vaping Use   Vaping Use: Never used  Substance and Sexual Activity   Alcohol use: Yes    Alcohol/week: 3.0 standard drinks    Types: 3 Cans of beer per week    Comment: 1-3 beers during the week, more on the weekends   Drug use: No   Sexual activity: Yes    Partners: Female  Other Topics Concern   Not on file  Social History Narrative   Lives with girlfriend and her child in a one bedroom home.  Has 5 children.  Works at [employer].  Education: high school.   Social Determinants of Health   Financial Resource Strain: Not on file  Food Insecurity: Not on file  Transportation Needs: Not on file  Physical Activity: Not on file  Stress: Not on file  Social Connections: Not on file  Intimate Partner Violence: Not on file     Constitutional: Denies fever, malaise, fatigue, headache or abrupt weight changes.  Respiratory: Denies difficulty breathing, shortness of breath, cough or sputum production.   Cardiovascular: Denies chest pain, chest tightness, palpitations or swelling in the hands or feet.  Gastrointestinal: Denies abdominal pain, bloating, constipation, diarrhea or blood in the stool.  Musculoskeletal: Denies decrease in range of motion, difficulty with gait, muscle pain or joint pain and swelling.  Skin: Denies redness, rashes, lesions or ulcercations.  Neurological: Denies dizziness, difficulty with memory, difficulty  with speech or problems with balance and coordination.  Psych: Denies anxiety, depression, SI/HI.  No other specific complaints in a complete review of systems (except as listed in HPI above).  Objective:   Physical Exam  BP 123/77 (BP Location: Left Arm, Patient Position: Sitting, Cuff Size: Normal)   Pulse 77   Temp 98 F (36.7 C) (Temporal)   Resp 17   Ht 5' 2.25" (1.581 m)   Wt 160 lb 3.2 oz (72.7 kg)   SpO2 100%   BMI 29.07 kg/m   Wt Readings from Last 3 Encounters:  08/17/20 156 lb (70.8 kg)  10/17/19 165 lb (74.8 kg)  05/26/19 158 lb (71.7 kg)    General: Appears his stated age, overweight in NAD. Skin: Warm, dry and intact.  HEENT: Head: normal shape and size; Eyes: EOMs intact;  Cardiovascular: Normal rate and rhythm. S1,S2 noted.  No murmur, rubs or gallops noted. No JVD or BLE edema. No carotid bruits noted. Pulmonary/Chest: Normal effort and positive vesicular breath sounds. No respiratory distress. No wheezes, rales or ronchi noted.  Abdomen: Soft and nontender. Normal bowel sounds.  Musculoskeletal:No difficulty with gait.  Neurological: Alert and oriented.  Psychiatric: Mood and affect normal. Behavior is normal. Judgment and thought content normal.    BMET    Component Value Date/Time   NA 140 07/09/2021 0819   K 4.3 07/09/2021 0819   CL 106 07/09/2021 0819   CO2 26 07/09/2021 0819   GLUCOSE 102 (H) 07/09/2021 0819   BUN 14 07/09/2021 0819   CREATININE 0.96 07/09/2021 0819   CALCIUM 10.4 (H) 07/09/2021 0819   GFRNONAA >60 04/06/2017 2018   GFRAA >60 04/06/2017 2018    Lipid Panel     Component Value Date/Time   CHOL 191 07/09/2021 0819   TRIG 292 (H) 07/09/2021 0819   HDL 49 07/09/2021 0819   CHOLHDL 3.9 07/09/2021 0819   VLDL 41.8 (H) 02/16/2020 0825   LDLCALC 100 (H) 07/09/2021 0819    CBC    Component Value Date/Time   WBC 4.8 07/09/2021 0819   RBC 4.76 07/09/2021 0819   HGB 15.5 07/09/2021 0819   HCT 46.2 07/09/2021 0819   PLT  155 07/09/2021 0819   MCV 97.1 07/09/2021 0819   MCH 32.6 07/09/2021 0819   MCHC 33.5 07/09/2021 0819   RDW 12.4 07/09/2021 0819    Hgb A1C Lab Results  Component Value Date   HGBA1C 5.5 07/09/2021            Assessment & Plan:   Webb Silversmith, NP This visit occurred during the SARS-CoV-2 public health emergency.  Safety protocols were in place, including screening questions  prior to the visit, additional usage of staff PPE, and extensive cleaning of exam room while observing appropriate contact time as indicated for disinfecting solutions.

## 2021-07-11 NOTE — Assessment & Plan Note (Signed)
Resolved

## 2021-08-20 ENCOUNTER — Encounter: Payer: 59 | Admitting: Internal Medicine

## 2021-10-11 ENCOUNTER — Other Ambulatory Visit: Payer: Self-pay

## 2021-10-11 DIAGNOSIS — E782 Mixed hyperlipidemia: Secondary | ICD-10-CM

## 2021-10-11 MED ORDER — EZETIMIBE 10 MG PO TABS: ORAL_TABLET | ORAL | 0 refills | Status: DC

## 2021-10-11 MED ORDER — FAMOTIDINE 10 MG PO TABS: 10.0000 mg | ORAL_TABLET | Freq: Two times a day (BID) | ORAL | 0 refills | Status: DC

## 2021-10-11 MED ORDER — SIMVASTATIN 40 MG PO TABS: 40.0000 mg | ORAL_TABLET | Freq: Every day | ORAL | 0 refills | Status: DC

## 2021-10-19 ENCOUNTER — Other Ambulatory Visit: Payer: Self-pay | Admitting: Internal Medicine

## 2021-10-19 NOTE — Telephone Encounter (Signed)
dc'd 07/11/21 Donnie Mesa CMA  Requested Prescriptions  Refused Prescriptions Disp Refills   pantoprazole (PROTONIX) 40 MG tablet [Pharmacy Med Name: PANTOPRAZOLE 40MG  TABLETS] 30 tablet 6    Sig: TAKE 1 TABLET BY MOUTH EVERY DAY     There is no refill protocol information for this order

## 2022-01-09 ENCOUNTER — Encounter: Payer: 59 | Admitting: Internal Medicine

## 2022-01-16 ENCOUNTER — Encounter: Payer: Self-pay | Admitting: Internal Medicine

## 2022-01-16 ENCOUNTER — Ambulatory Visit (INDEPENDENT_AMBULATORY_CARE_PROVIDER_SITE_OTHER): Payer: 59 | Admitting: Internal Medicine

## 2022-01-16 VITALS — BP 138/84 | HR 73 | Temp 96.9°F | Ht 64.0 in | Wt 164.0 lb

## 2022-01-16 DIAGNOSIS — E782 Mixed hyperlipidemia: Secondary | ICD-10-CM

## 2022-01-16 DIAGNOSIS — K219 Gastro-esophageal reflux disease without esophagitis: Secondary | ICD-10-CM | POA: Diagnosis not present

## 2022-01-16 DIAGNOSIS — Z6828 Body mass index (BMI) 28.0-28.9, adult: Secondary | ICD-10-CM | POA: Diagnosis not present

## 2022-01-16 DIAGNOSIS — E663 Overweight: Secondary | ICD-10-CM

## 2022-01-16 DIAGNOSIS — I700 Atherosclerosis of aorta: Secondary | ICD-10-CM | POA: Diagnosis not present

## 2022-01-16 DIAGNOSIS — Z1211 Encounter for screening for malignant neoplasm of colon: Secondary | ICD-10-CM | POA: Diagnosis not present

## 2022-01-16 DIAGNOSIS — Z1159 Encounter for screening for other viral diseases: Secondary | ICD-10-CM | POA: Diagnosis not present

## 2022-01-16 DIAGNOSIS — Z0001 Encounter for general adult medical examination with abnormal findings: Secondary | ICD-10-CM

## 2022-01-16 MED ORDER — EZETIMIBE 10 MG PO TABS: ORAL_TABLET | ORAL | 1 refills | Status: DC

## 2022-01-16 MED ORDER — ASPIRIN 81 MG PO TBEC: 81.0000 mg | DELAYED_RELEASE_TABLET | Freq: Every day | ORAL | 12 refills | Status: DC

## 2022-01-16 MED ORDER — FAMOTIDINE 10 MG PO TABS: 10.0000 mg | ORAL_TABLET | Freq: Two times a day (BID) | ORAL | 1 refills | Status: AC

## 2022-01-16 MED ORDER — SIMVASTATIN 40 MG PO TABS: 40.0000 mg | ORAL_TABLET | Freq: Every day | ORAL | 1 refills | Status: DC

## 2022-01-16 NOTE — Progress Notes (Signed)
? ?Subjective:  ? ? Patient ID: Kevin Graham, male    DOB: 05/07/1971, 51 y.o.   MRN: [id number] ? ?HPI ? ?Patient presents to clinic today for his annual exam. ? ?Flu: 06/2021 ?Tetanus: 05/2016 ?COVID: Moderna x2 ?Shingrix: never ?PSA screening: 06/2021 ?Colon screening: never ?Vision screening: as needed ?Dentist: biannually ? ?Diet: he does eat meat. He consumes fruits and veggies. He does eat fried foods. He drinks mostly water, coffee, tea, alcohol ?Exercise: None ? ?Review of Systems ? ?   ?Past Medical History:  ?Diagnosis Date  ? Environmental allergies   ? History of stomach ulcers   ? Seasonal allergies   ? ? ?Current Outpatient Medications  ?Medication Sig Dispense Refill  ? azelastine (OPTIVAR) 0.05 % ophthalmic solution Place 1 drop into both eyes daily as needed (for itchy/watery eyes). As needed 6 mL 5  ? cetirizine (ZYRTEC) 10 MG tablet Take 1 tablet (10 mg total) by mouth daily as needed for allergies or rhinitis. 34 tablet 1  ? EPINEPHrine 0.3 mg/0.3 mL IJ SOAJ injection Inject 0.3 mg into the muscle once.    ? ezetimibe (ZETIA) 10 MG tablet TAKE 1 TABLET(10 MG) BY MOUTH DAILY 90 tablet 0  ? famotidine (PEPCID) 10 MG tablet Take 1 tablet (10 mg total) by mouth 2 (two) times daily. 180 tablet 0  ? fluticasone (FLONASE) 50 MCG/ACT nasal spray SHAKE LIQUID AND USE 1 SPRAY IN EACH NOSTRIL TWICE DAILY AS NEEDED 16 g 0  ? naproxen sodium (ALEVE) 220 MG tablet Take 220 mg by mouth as needed.    ? simvastatin (ZOCOR) 40 MG tablet Take 1 tablet (40 mg total) by mouth daily at 6 PM. 90 tablet 0  ? ?No current facility-administered medications for this visit.  ? ? ?No Known Allergies ? ?Family History  ?Problem Relation Age of Onset  ? Hyperlipidemia Mother   ? ? ?Social History  ? ?Socioeconomic History  ? Marital status: Single  ?  Spouse name: Not on file  ? Number of children: Not on file  ? Years of education: Not on file  ? Highest education level: Not on file  ?Occupational History  ? Not on file  ?Tobacco  Use  ? Smoking status: Every Day  ?  Packs/day: 0.25  ?  Years: 38.00  ?  Pack years: 9.50  ?  Types: Cigarettes  ? Smokeless tobacco: Never  ? Tobacco comments:  ?  2 packs per week  ?Vaping Use  ? Vaping Use: Never used  ?Substance and Sexual Activity  ? Alcohol use: Yes  ?  Alcohol/week: 3.0 standard drinks  ?  Types: 3 Cans of beer per week  ?  Comment: 1-3 beers during the week, more on the weekends  ? Drug use: No  ? Sexual activity: Yes  ?  Partners: Female  ?Other Topics Concern  ? Not on file  ?Social History Narrative  ? Lives with girlfriend and her child in a one bedroom home.  Has 5 children.  Works at [employer].  Education: high school.  ? ?Social Determinants of Health  ? ?Financial Resource Strain: Not on file  ?Food Insecurity: Not on file  ?Transportation Needs: Not on file  ?Physical Activity: Not on file  ?Stress: Not on file  ?Social Connections: Not on file  ?Intimate Partner Violence: Not on file  ? ? ?Constitutional: Denies fever, malaise, fatigue, headache or abrupt weight changes.  ?HEENT: Denies eye pain, eye redness, ear pain, ringing in the  ears, wax buildup, runny nose, nasal congestion, bloody nose, or sore throat. ?Respiratory: Denies difficulty breathing, shortness of breath, cough or sputum production.   ?Cardiovascular: Denies chest pain, chest tightness, palpitations or swelling in the hands or feet.  ?Gastrointestinal: Denies abdominal pain, bloating, constipation, diarrhea or blood in the stool.  ?GU: Denies urgency, frequency, pain with urination, burning sensation, blood in urine, odor or discharge. ?Musculoskeletal: Denies decrease in range of motion, difficulty with gait, muscle pain or joint pain and swelling.  ?Skin: Denies redness, rashes, lesions or ulcercations.  ?Neurological: Denies dizziness, difficulty with memory, difficulty with speech or problems with balance and coordination.  ?Psych: Denies anxiety, depression, SI/HI. ? ?No other specific complaints in a  complete review of systems (except as listed in HPI above). ? ?Objective:  ? Physical Exam ? ?BP 138/84 (BP Location: Left Arm, Patient Position: Sitting, Cuff Size: Normal)   Pulse 73   Temp (!) 96.9 ?F (36.1 ?C) (Temporal)   Ht '5\' 4"'  (1.626 m)   Wt 164 lb (74.4 kg)   SpO2 99%   BMI 28.15 kg/m?  ? ?Wt Readings from Last 3 Encounters:  ?07/11/21 160 lb 3.2 oz (72.7 kg)  ?08/17/20 156 lb (70.8 kg)  ?10/17/19 165 lb (74.8 kg)  ? ? ?General: Appears his stated age, overweight, in NAD. ?Skin: Warm, dry and intact.  ?HEENT: Head: normal shape and size; Eyes: sclera white, no icterus, conjunctiva pink, PERRLA and EOMs intact;  ?Neck:  Neck supple, trachea midline. No masses, lumps or thyromegaly present.  ?Cardiovascular: Normal rate and rhythm. S1,S2 noted.  No murmur, rubs or gallops noted. No JVD or BLE edema. No carotid bruits noted. ?Pulmonary/Chest: Normal effort and positive vesicular breath sounds. No respiratory distress. No wheezes, rales or ronchi noted.  ?Abdomen: Soft and nontender. Normal bowel sounds.  ?Musculoskeletal: Strength 5/5 BUE/BLE. No difficulty with gait.  ?Neurological: Alert and oriented. Cranial nerves II-XII grossly intact. Coordination normal.  ?Psychiatric: Mood and affect normal. Behavior is normal. Judgment and thought content normal.  ? ? ?BMET ?   ?Component Value Date/Time  ? NA 140 07/09/2021 0819  ? K 4.3 07/09/2021 0819  ? CL 106 07/09/2021 0819  ? CO2 26 07/09/2021 0819  ? GLUCOSE 102 (H) 07/09/2021 0819  ? BUN 14 07/09/2021 0819  ? CREATININE 0.96 07/09/2021 0819  ? CALCIUM 10.4 (H) 07/09/2021 0819  ? GFRNONAA >60 04/06/2017 2018  ? GFRAA >60 04/06/2017 2018  ? ? ?Lipid Panel  ?   ?Component Value Date/Time  ? CHOL 191 07/09/2021 0819  ? TRIG 292 (H) 07/09/2021 0819  ? HDL 49 07/09/2021 0819  ? CHOLHDL 3.9 07/09/2021 0819  ? VLDL 41.8 (H) 02/16/2020 0825  ? LDLCALC 100 (H) 07/09/2021 0819  ? ? ?CBC ?   ?Component Value Date/Time  ? WBC 4.8 07/09/2021 0819  ? RBC 4.76  07/09/2021 0819  ? HGB 15.5 07/09/2021 0819  ? HCT 46.2 07/09/2021 0819  ? PLT 155 07/09/2021 0819  ? MCV 97.1 07/09/2021 0819  ? MCH 32.6 07/09/2021 0819  ? MCHC 33.5 07/09/2021 0819  ? RDW 12.4 07/09/2021 0819  ? ? ?Hgb A1C ?Lab Results  ?Component Value Date  ? HGBA1C 5.5 07/09/2021  ? ? ? ? ? ? ? ?   ?Assessment & Plan:  ? ?Preventative Health Maintenance: ? ?Encouraged him to get a flu shot in the fall ?Tetanus UTD ?Encouraged him to get his covid booster ?Discussed Shingrix vaccine, he will check coverage with his insurance  company and schedule a nurse visit if he would like to get this done ?Referral to GI for screening conolonsocpy ?Encouraged him to consume a balanced diet and exercise regimen ?Advised him to see an eye doctor and dentist annually ?We will check CBC, c-Met, lipid profile and hep C today ? ?RTC in 6 months, follow-up chronic conditions ?Webb Silversmith, NP ? ?

## 2022-01-16 NOTE — Patient Instructions (Signed)
Health Maintenance, Male Adopting a healthy lifestyle and getting preventive care are important in promoting health and wellness. Ask your health care provider about: The right schedule for you to have regular tests and exams. Things you can do on your own to prevent diseases and keep yourself healthy. What should I know about diet, weight, and exercise? Eat a healthy diet  Eat a diet that includes plenty of vegetables, fruits, low-fat dairy products, and lean protein. Do not eat a lot of foods that are high in solid fats, added sugars, or sodium. Maintain a healthy weight Body mass index (BMI) is a measurement that can be used to identify possible weight problems. It estimates body fat based on height and weight. Your health care provider can help determine your BMI and help you achieve or maintain a healthy weight. Get regular exercise Get regular exercise. This is one of the most important things you can do for your health. Most adults should: Exercise for at least 150 minutes each week. The exercise should increase your heart rate and make you sweat (moderate-intensity exercise). Do strengthening exercises at least twice a week. This is in addition to the moderate-intensity exercise. Spend less time sitting. Even light physical activity can be beneficial. Watch cholesterol and blood lipids Have your blood tested for lipids and cholesterol at 51 years of age, then have this test every 5 years. You may need to have your cholesterol levels checked more often if: Your lipid or cholesterol levels are high. You are older than 51 years of age. You are at high risk for heart disease. What should I know about cancer screening? Many types of cancers can be detected early and may often be prevented. Depending on your health history and family history, you may need to have cancer screening at various ages. This may include screening for: Colorectal cancer. Prostate cancer. Skin cancer. Lung  cancer. What should I know about heart disease, diabetes, and high blood pressure? Blood pressure and heart disease High blood pressure causes heart disease and increases the risk of stroke. This is more likely to develop in people who have high blood pressure readings or are overweight. Talk with your health care provider about your target blood pressure readings. Have your blood pressure checked: Every 3-5 years if you are 18-39 years of age. Every year if you are 40 years old or older. If you are between the ages of 65 and 75 and are a current or former smoker, ask your health care provider if you should have a one-time screening for abdominal aortic aneurysm (AAA). Diabetes Have regular diabetes screenings. This checks your fasting blood sugar level. Have the screening done: Once every three years after age 45 if you are at a normal weight and have a low risk for diabetes. More often and at a younger age if you are overweight or have a high risk for diabetes. What should I know about preventing infection? Hepatitis B If you have a higher risk for hepatitis B, you should be screened for this virus. Talk with your health care provider to find out if you are at risk for hepatitis B infection. Hepatitis C Blood testing is recommended for: Everyone born from 1945 through 1965. Anyone with known risk factors for hepatitis C. Sexually transmitted infections (STIs) You should be screened each year for STIs, including gonorrhea and chlamydia, if: You are sexually active and are younger than 51 years of age. You are older than 51 years of age and your   health care provider tells you that you are at risk for this type of infection. Your sexual activity has changed since you were last screened, and you are at increased risk for chlamydia or gonorrhea. Ask your health care provider if you are at risk. Ask your health care provider about whether you are at high risk for HIV. Your health care provider  may recommend a prescription medicine to help prevent HIV infection. If you choose to take medicine to prevent HIV, you should first get tested for HIV. You should then be tested every 3 months for as long as you are taking the medicine. Follow these instructions at home: Alcohol use Do not drink alcohol if your health care provider tells you not to drink. If you drink alcohol: Limit how much you have to 0-2 drinks a day. Know how much alcohol is in your drink. In the [place] one drink equals one 12 oz bottle of beer (355 mL), one 5 oz glass of wine (148 mL), or one 1 oz glass of hard liquor (44 mL). Lifestyle Do not use any products that contain nicotine or tobacco. These products include cigarettes, chewing tobacco, and vaping devices, such as e-cigarettes. If you need help quitting, ask your health care provider. Do not use street drugs. Do not share needles. Ask your health care provider for help if you need support or information about quitting drugs. General instructions Schedule regular health, dental, and eye exams. Stay current with your vaccines. Tell your health care provider if: You often feel depressed. You have ever been abused or do not feel safe at home. Summary Adopting a healthy lifestyle and getting preventive care are important in promoting health and wellness. Follow your health care provider's instructions about healthy diet, exercising, and getting tested or screened for diseases. Follow your health care provider's instructions on monitoring your cholesterol and blood pressure. This information is not intended to replace advice given to you by your health care provider. Make sure you discuss any questions you have with your health care provider. Document Revised: 02/04/2021 Document Reviewed: 02/04/2021 Elsevier Patient Education  2023 [employer]  

## 2022-01-16 NOTE — Assessment & Plan Note (Signed)
Encouraged diet and exercise for weight loss ?

## 2022-01-16 NOTE — Assessment & Plan Note (Signed)
CMET and lipid profile today ?Encouraged him to consume a low fat diet ?Continue Simvastatin and Ezetimibe ?

## 2022-01-16 NOTE — Assessment & Plan Note (Signed)
Encouraged weight loss as this can help reduce reflux symptoms ?Continue Famotidine ?

## 2022-01-16 NOTE — Assessment & Plan Note (Signed)
CMET and lipid profile today ?Continue Simvastatin and Ezetimibe ?Will have him start a baby ASA ?

## 2022-01-17 ENCOUNTER — Other Ambulatory Visit: Payer: Self-pay

## 2022-01-17 DIAGNOSIS — Z1211 Encounter for screening for malignant neoplasm of colon: Secondary | ICD-10-CM

## 2022-01-17 LAB — COMPLETE METABOLIC PANEL WITH GFR
AG Ratio: 2 (calc) (ref 1.0–2.5)
ALT: 43 U/L (ref 9–46)
AST: 25 U/L (ref 10–35)
Albumin: 4.5 g/dL (ref 3.6–5.1)
Alkaline phosphatase (APISO): 55 U/L (ref 35–144)
BUN: 16 mg/dL (ref 7–25)
CO2: 22 mmol/L (ref 20–32)
Calcium: 9.7 mg/dL (ref 8.6–10.3)
Chloride: 108 mmol/L (ref 98–110)
Creat: 0.72 mg/dL (ref 0.70–1.30)
Globulin: 2.3 g/dL (calc) (ref 1.9–3.7)
Glucose, Bld: 108 mg/dL — ABNORMAL HIGH (ref 65–99)
Potassium: 4 mmol/L (ref 3.5–5.3)
Sodium: 139 mmol/L (ref 135–146)
Total Bilirubin: 0.3 mg/dL (ref 0.2–1.2)
Total Protein: 6.8 g/dL (ref 6.1–8.1)
eGFR: 111 mL/min/{1.73_m2} (ref >=60)

## 2022-01-17 MED ORDER — NA SULFATE-K SULFATE-MG SULF 17.5-3.13-1.6 GM/177ML PO SOLN: 1.0000 | Freq: Once | ORAL | 0 refills | Status: AC

## 2022-01-17 NOTE — Addendum Note (Signed)
Addended by: Jearld Fenton on: 01/17/2022 12:48 PM ? ? Modules accepted: Orders ? ?

## 2022-01-17 NOTE — Progress Notes (Signed)
Gastroenterology Pre-Procedure Review ? ?Request Date: 01/29/22 ?Requesting Physician: Dr. Vicente Males ? ?PATIENT REVIEW QUESTIONS: The patient responded to the following health history questions as indicated:   ? ?1. Are you having any GI issues? Reflux takes meds to help ?2. Do you have a personal history of Polyps? no ?3. Do you have a family history of Colon Cancer or Polyps? no ?4. Diabetes Mellitus? no ?5. Joint replacements in the past 12 months?no ?6. Major health problems in the past 3 months?no ?7. Any artificial heart valves, MVP, or defibrillator?no ?   ?MEDICATIONS & ALLERGIES:    ?Patient reports the following regarding taking any anticoagulation/antiplatelet therapy:   ?Plavix, Coumadin, Eliquis, Xarelto, Lovenox, Pradaxa, Brilinta, or Effient? no ?Aspirin? no ? ?Patient confirms/reports the following medications:  ?Current Outpatient Medications  ?Medication Sig Dispense Refill  ? aspirin 81 MG EC tablet Take 1 tablet (81 mg total) by mouth daily. Swallow whole. 30 tablet 12  ? azelastine (OPTIVAR) 0.05 % ophthalmic solution Place 1 drop into both eyes daily as needed (for itchy/watery eyes). As needed 6 mL 5  ? cetirizine (ZYRTEC) 10 MG tablet Take 1 tablet (10 mg total) by mouth daily as needed for allergies or rhinitis. 34 tablet 1  ? EPINEPHrine 0.3 mg/0.3 mL IJ SOAJ injection Inject 0.3 mg into the muscle once.    ? ezetimibe (ZETIA) 10 MG tablet TAKE 1 TABLET(10 MG) BY MOUTH DAILY 90 tablet 1  ? famotidine (PEPCID) 10 MG tablet Take 1 tablet (10 mg total) by mouth 2 (two) times daily. 180 tablet 1  ? fluticasone (FLONASE) 50 MCG/ACT nasal spray SHAKE LIQUID AND USE 1 SPRAY IN EACH NOSTRIL TWICE DAILY AS NEEDED 16 g 0  ? naproxen sodium (ALEVE) 220 MG tablet Take 220 mg by mouth as needed.    ? simvastatin (ZOCOR) 40 MG tablet Take 1 tablet (40 mg total) by mouth daily at 6 PM. 90 tablet 1  ? ?No current facility-administered medications for this visit.  ? ? ?Patient confirms/reports the following  allergies:  ?No Known Allergies ? ?No orders of the defined types were placed in this encounter. ? ? ?AUTHORIZATION INFORMATION ?Primary Insurance: ?1D#: ?Group #: ? ?Secondary Insurance: ?1D#: ?Group #: ? ?SCHEDULE INFORMATION: ?Date: 01/29/22 ?Time: ?Location: [facility] ?

## 2022-01-24 DIAGNOSIS — Z0001 Encounter for general adult medical examination with abnormal findings: Secondary | ICD-10-CM | POA: Diagnosis not present

## 2022-01-24 DIAGNOSIS — Z1159 Encounter for screening for other viral diseases: Secondary | ICD-10-CM | POA: Diagnosis not present

## 2022-01-27 LAB — HEPATITIS C ANTIBODY
Hepatitis C Ab: NONREACTIVE
SIGNAL TO CUT-OFF: 0.08 (ref <1.00)

## 2022-01-27 LAB — CBC
HCT: 49.1 % (ref 38.5–50.0)
Hemoglobin: 16.4 g/dL (ref 13.2–17.1)
MCH: 32.4 pg (ref 27.0–33.0)
MCHC: 33.4 g/dL (ref 32.0–36.0)
MCV: 97 fL (ref 80.0–100.0)
MPV: 9.7 fL (ref 7.5–12.5)
Platelets: 177 10*3/uL (ref 140–400)
RBC: 5.06 10*6/uL (ref 4.20–5.80)
RDW: 12.5 % (ref 11.0–15.0)
WBC: 5.5 10*3/uL (ref 3.8–10.8)

## 2022-01-27 LAB — LIPID PANEL
Cholesterol: 206 mg/dL — ABNORMAL HIGH (ref <200)
HDL: 42 mg/dL (ref >=40)
Non-HDL Cholesterol (Calc): 164 mg/dL (calc) — ABNORMAL HIGH (ref <130)
Total CHOL/HDL Ratio: 4.9 (calc) (ref <5.0)
Triglycerides: 776 mg/dL — ABNORMAL HIGH (ref <150)

## 2022-01-28 ENCOUNTER — Encounter: Payer: Self-pay | Admitting: Gastroenterology

## 2022-01-28 MED ORDER — ROSUVASTATIN CALCIUM 10 MG PO TABS: 10.0000 mg | ORAL_TABLET | Freq: Every day | ORAL | 1 refills | Status: DC

## 2022-01-28 NOTE — Addendum Note (Signed)
Addended by: Jearld Fenton on: 01/28/2022 11:08 AM ? ? Modules accepted: Orders ? ?

## 2022-01-29 ENCOUNTER — Encounter: Payer: Self-pay | Admitting: Gastroenterology

## 2022-01-29 ENCOUNTER — Ambulatory Visit: Payer: 59 | Admitting: Certified Registered Nurse Anesthetist

## 2022-01-29 ENCOUNTER — Ambulatory Visit
Admission: RE | Admit: 2022-01-29 | Discharge: 2022-01-29 | Disposition: A | Discharge status: Home / Self Care | Payer: 59 | Attending: Gastroenterology | Admitting: Gastroenterology

## 2022-01-29 ENCOUNTER — Encounter
Admission: RE | Disposition: A | Discharge status: Home / Self Care | Payer: Self-pay | Source: Home / Self Care | Attending: Gastroenterology

## 2022-01-29 DIAGNOSIS — D124 Benign neoplasm of descending colon: Secondary | ICD-10-CM | POA: Insufficient documentation

## 2022-01-29 DIAGNOSIS — F17210 Nicotine dependence, cigarettes, uncomplicated: Secondary | ICD-10-CM | POA: Diagnosis not present

## 2022-01-29 DIAGNOSIS — Z1211 Encounter for screening for malignant neoplasm of colon: Secondary | ICD-10-CM | POA: Diagnosis not present

## 2022-01-29 DIAGNOSIS — R69 Illness, unspecified: Secondary | ICD-10-CM | POA: Diagnosis not present

## 2022-01-29 DIAGNOSIS — K219 Gastro-esophageal reflux disease without esophagitis: Secondary | ICD-10-CM | POA: Diagnosis not present

## 2022-01-29 DIAGNOSIS — K635 Polyp of colon: Secondary | ICD-10-CM | POA: Diagnosis not present

## 2022-01-29 DIAGNOSIS — K5792 Diverticulitis of intestine, part unspecified, without perforation or abscess without bleeding: Secondary | ICD-10-CM | POA: Diagnosis not present

## 2022-01-29 HISTORY — PX: COLONOSCOPY WITH PROPOFOL: SHX5780

## 2022-01-29 SURGERY — COLONOSCOPY WITH PROPOFOL
Anesthesia: General

## 2022-01-29 MED ORDER — PROPOFOL 10 MG/ML IV BOLUS
INTRAVENOUS | Status: DC | PRN
  Administered 2022-01-29: 20 mg via INTRAVENOUS
  Administered 2022-01-29: 80 mg via INTRAVENOUS

## 2022-01-29 MED ORDER — LIDOCAINE HCL (CARDIAC) PF 100 MG/5ML IV SOSY
PREFILLED_SYRINGE | INTRAVENOUS | Status: DC | PRN
  Administered 2022-01-29: 50 mg via INTRAVENOUS

## 2022-01-29 MED ORDER — STERILE WATER FOR IRRIGATION IR SOLN
Status: DC | PRN
  Administered 2022-01-29: 200 mL

## 2022-01-29 MED ORDER — SODIUM CHLORIDE 0.9 % IV SOLN: INTRAVENOUS | Status: DC

## 2022-01-29 MED ORDER — PROPOFOL 500 MG/50ML IV EMUL
INTRAVENOUS | Status: DC | PRN
  Administered 2022-01-29: 140 ug/kg/min via INTRAVENOUS

## 2022-01-29 NOTE — Anesthesia Procedure Notes (Signed)
Date/Time: 01/29/2022 9:34 AM ?Performed by: Johnna Acosta, CRNA ?Pre-anesthesia Checklist: Patient identified, Emergency Drugs available, Suction available, Patient being monitored and Timeout performed ?Patient Re-evaluated:Patient Re-evaluated prior to induction ?Oxygen Delivery Method: Nasal cannula ?Preoxygenation: Pre-oxygenation with 100% oxygen ?Induction Type: IV induction ? ? ? ? ?

## 2022-01-29 NOTE — Anesthesia Preprocedure Evaluation (Signed)
Anesthesia Evaluation  ?Patient identified by MRN, date of birth, ID band ?Patient awake ? ? ? ?Reviewed: ?Allergy & Precautions, NPO status , Patient's Chart, lab work & pertinent test results ? ?Airway ?Mallampati: II ? ?TM Distance: >3 FB ?Neck ROM: Full ? ? ? Dental ? ?(+) Teeth Intact ?  ?Pulmonary ?neg pulmonary ROS, Current Smoker,  ?  ?Pulmonary exam normal ? ?+ decreased breath sounds ? ? ? ? ? Cardiovascular ?Exercise Tolerance: Good ?negative cardio ROS ?Normal cardiovascular exam ?Rhythm:Regular Rate:Normal ? ? ?  ?Neuro/Psych ?negative neurological ROS ? negative psych ROS  ? GI/Hepatic ?negative GI ROS, Neg liver ROS, GERD  ,  ?Endo/Other  ?negative endocrine ROS ? Renal/GU ?negative Renal ROS  ?negative genitourinary ?  ?Musculoskeletal ?negative musculoskeletal ROS ?(+)  ? Abdominal ?Normal abdominal exam  (+)   ?Peds ?negative pediatric ROS ?(+)  Hematology ?negative hematology ROS ?(+)   ?Anesthesia Other Findings ?Past Medical History: ?No date: Environmental allergies ?No date: History of stomach ulcers ?No date: Seasonal allergies ? ?Past Surgical History: ?No date: NO PAST SURGERIES ? ?BMI   ? Body Mass Index: 27.46 kg/m?  ?  ? ? Reproductive/Obstetrics ?negative OB ROS ? ?  ? ? ? ? ? ? ? ? ? ? ? ? ? ?  ?  ? ? ? ? ? ? ? ? ?Anesthesia Physical ?Anesthesia Plan ? ?ASA: 2 ? ?Anesthesia Plan: General  ? ?Post-op Pain Management:   ? ?Induction: Intravenous ? ?PONV Risk Score and Plan: Propofol infusion and TIVA ? ?Airway Management Planned: Natural Airway and Nasal Cannula ? ?Additional Equipment:  ? ?Intra-op Plan:  ? ?Post-operative Plan:  ? ?Informed Consent: I have reviewed the patients History and Physical, chart, labs and discussed the procedure including the risks, benefits and alternatives for the proposed anesthesia with the patient or authorized representative who has indicated his/her understanding and acceptance.  ? ? ? ?Dental Advisory Given ? ?Plan  Discussed with: CRNA and Surgeon ? ?Anesthesia Plan Comments:   ? ? ? ? ? ? ?Anesthesia Quick Evaluation ? ?

## 2022-01-29 NOTE — Op Note (Signed)
[facility] ?Gastroenterology ?Patient Name: Kevin Graham ?Procedure Date: 01/29/2022 9:36 AM ?MRN: [id number] ?Account #: [id number] ?Date of Birth: 1971-03-16 ?Admit Type: Outpatient ?Age: 51 ?Room: [facility] ENDO ROOM 3 ?Gender: Male ?Note Status: Finalized ?Instrument Name: Colonscope [id number] ?Procedure:             Colonoscopy ?Indications:           Screening for colorectal malignant neoplasm ?Providers:             [name] MD, MD ?Referring MD:          Jearld Fenton (Referring MD) ?Medicines:             Monitored Anesthesia Care ?Complications:         No immediate complications. ?Procedure:             Pre-Anesthesia Assessment: ?                       - Prior to the procedure, a History and Physical was  ?                       performed, and patient medications, allergies and  ?                       sensitivities were reviewed. The patient's tolerance  ?                       of previous anesthesia was reviewed. ?                       - The risks and benefits of the procedure and the  ?                       sedation options and risks were discussed with the  ?                       patient. All questions were answered and informed  ?                       consent was obtained. ?                       - ASA Grade Assessment: II - A patient with mild  ?                       systemic disease. ?                       After obtaining informed consent, the colonoscope was  ?                       passed under direct vision. Throughout the procedure,  ?                       the patient's blood pressure, pulse, and oxygen  ?                       saturations were monitored continuously. The  ?                       Colonoscope was introduced  through the anus and  ?                       advanced to the the cecum, identified by the  ?                       appendiceal orifice. The colonoscopy was performed  ?                       with ease. The patient tolerated the procedure well.  ?                        The quality of the bowel preparation was excellent. ?Findings: ?     The perianal and digital rectal examinations were normal. ?     A 7 mm polyp was found in the descending colon. The polyp was sessile.  ?     The polyp was removed with a cold snare. Resection and retrieval were  ?     complete. ?     The exam was otherwise without abnormality on direct and retroflexion  ?     views. ?Impression:            - One 7 mm polyp in the descending colon, removed with  ?                       a cold snare. Resected and retrieved. ?                       - The examination was otherwise normal on direct and  ?                       retroflexion views. ?Recommendation:        - Discharge patient to home (with escort). ?                       - Resume previous diet. ?                       - Continue present medications. ?                       - Await pathology results. ?                       - Repeat colonoscopy for surveillance based on  ?                       pathology results. ?Procedure Code(s):     --- Professional --- ?                       [phone], Colonoscopy, flexible; with removal of  ?                       tumor(s), polyp(s), or other lesion(s) by snare  ?                       technique ?Diagnosis Code(s):     --- Professional --- ?  Z12.11, Encounter for screening for malignant neoplasm  ?                       of colon ?                       K63.5, Polyp of colon ?CPT copyright 2019 American Medical Association. All rights reserved. ?The codes documented in this report are preliminary and upon coder review may  ?be revised to meet current compliance requirements. ?[name], MD ?[name] MD, MD ?01/29/2022 9:58:41 AM ?This report has been signed electronically. ?Number of Addenda: 0 ?Note Initiated On: 01/29/2022 9:36 AM ?Scope Withdrawal Time: 0 hours 10 minutes 39 seconds  ?Total Procedure Duration: 0 hours 12 minutes 10 seconds  ?Estimated Blood Loss:  Estimated blood  loss: none. ?     [facility] ?

## 2022-01-29 NOTE — Anesthesia Postprocedure Evaluation (Signed)
Anesthesia Post Note ? ?Patient: Kevin Graham ? ?Procedure(s) Performed: COLONOSCOPY WITH PROPOFOL ? ?Patient location during evaluation: PACU ?Anesthesia Type: General ?Level of consciousness: awake and oriented ?Pain management: satisfactory to patient ?Vital Signs Assessment: post-procedure vital signs reviewed and stable ?Respiratory status: nonlabored ventilation and respiratory function stable ?Cardiovascular status: stable ?Anesthetic complications: no ? ? ?No notable events documented. ? ? ?Last Vitals:  ?Vitals:  ? 01/29/22 1020 01/29/22 1030  ?BP: 120/82 135/81  ?Pulse: 61 61  ?Resp: 16 (!) 21  ?Temp:    ?SpO2: 100% 99%  ?  ?Last Pain:  ?Vitals:  ? 01/29/22 1000  ?TempSrc: Temporal  ?PainSc:   ? ? ?  ?  ?  ?  ?  ?  ? ?VAN STAVEREN,Tiyon Sanor ? ? ? ? ?

## 2022-01-29 NOTE — Transfer of Care (Signed)
Immediate Anesthesia Transfer of Care Note ? ?Patient: Kevin Graham ? ?Procedure(s) Performed: COLONOSCOPY WITH PROPOFOL ? ?Patient Location: PACU ? ?Anesthesia Type:General ? ?Level of Consciousness: sedated ? ?Airway & Oxygen Therapy: Patient Spontanous Breathing ? ?Post-op Assessment: Report given to RN and Post -op Vital signs reviewed and stable ? ?Post vital signs: Reviewed and stable ? ?Last Vitals:  ?Vitals Value Taken Time  ?BP 95/60 01/29/22 1002  ?Temp 36 ?C 01/29/22 1000  ?Pulse 64 01/29/22 1002  ?Resp 12 01/29/22 1002  ?SpO2 96 % 01/29/22 1002  ?Vitals shown include unvalidated device data. ? ?Last Pain:  ?Vitals:  ? 01/29/22 1000  ?TempSrc: Temporal  ?PainSc:   ?   ? ?  ? ?Complications: No notable events documented. ?

## 2022-01-29 NOTE — H&P (Signed)
? ? ? ?[name], MD ?[address] [place], [place], [place], [address] ?[address] [place], [place], [place], [address] ?Phone: [phone]  ?Fax: [phone] ? ?Primary Care Physician:  Jearld Fenton, NP ? ? ?Pre-Procedure History & Physical: ?HPI:  Kevin Graham is a 51 y.o. male is here for an colonoscopy. ?  ?Past Medical History:  ?Diagnosis Date  ? Environmental allergies   ? History of stomach ulcers   ? Seasonal allergies   ? ? ?Past Surgical History:  ?Procedure Laterality Date  ? NO PAST SURGERIES    ? ? ?Prior to Admission medications   ?Medication Sig Start Date End Date Taking? Authorizing Provider  ?aspirin 81 MG EC tablet Take 1 tablet (81 mg total) by mouth daily. Swallow whole. 01/16/22  Yes Jearld Fenton, NP  ?azelastine (OPTIVAR) 0.05 % ophthalmic solution Place 1 drop into both eyes daily as needed (for itchy/watery eyes). As needed 10/17/19  Yes Garnet Sierras, DO  ?cetirizine (ZYRTEC) 10 MG tablet Take 1 tablet (10 mg total) by mouth daily as needed for allergies or rhinitis. 06/21/20  Yes Garnet Sierras, DO  ?ezetimibe (ZETIA) 10 MG tablet TAKE 1 TABLET(10 MG) BY MOUTH DAILY 01/16/22  Yes Jearld Fenton, NP  ?famotidine (PEPCID) 10 MG tablet Take 1 tablet (10 mg total) by mouth 2 (two) times daily. 01/16/22  Yes Baity, Coralie Keens, NP  ?fluticasone (FLONASE) 50 MCG/ACT nasal spray SHAKE LIQUID AND USE 1 SPRAY IN EACH NOSTRIL TWICE DAILY AS NEEDED 04/30/20  Yes Garnet Sierras, DO  ?naproxen sodium (ALEVE) 220 MG tablet Take 220 mg by mouth as needed.   Yes [provider]  ?rosuvastatin (CRESTOR) 10 MG tablet Take 1 tablet (10 mg total) by mouth daily. 01/28/22  Yes Jearld Fenton, NP  ?EPINEPHrine 0.3 mg/0.3 mL IJ SOAJ injection Inject 0.3 mg into the muscle once.    [provider]  ? ? ?Allergies as of 01/17/2022  ? (No Known Allergies)  ? ? ?Family History  ?Problem Relation Age of Onset  ? Hyperlipidemia Mother   ? ? ?Social History  ? ?Socioeconomic History  ? Marital status:  Single  ?  Spouse name: Not on file  ? Number of children: Not on file  ? Years of education: Not on file  ? Highest education level: Not on file  ?Occupational History  ? Not on file  ?Tobacco Use  ? Smoking status: Every Day  ?  Packs/day: 0.25  ?  Years: 38.00  ?  Pack years: 9.50  ?  Types: Cigarettes  ? Smokeless tobacco: Never  ? Tobacco comments:  ?  2 packs per week  ?Vaping Use  ? Vaping Use: Never used  ?Substance and Sexual Activity  ? Alcohol use: Yes  ?  Alcohol/week: 3.0 standard drinks  ?  Types: 3 Cans of beer per week  ?  Comment: 1-3 beers during the week, more on the weekends  ? Drug use: No  ? Sexual activity: Yes  ?  Partners: Female  ?Other Topics Concern  ? Not on file  ?Social History Narrative  ? Lives with girlfriend and her child in a one bedroom home.  Has 5 children.  Works at [employer].  Education: high school.  ? ?Social Determinants of Health  ? ?Financial Resource Strain: Not on file  ?Food Insecurity: Not on file  ?Transportation Needs: Not on file  ?Physical Activity: Not on file  ?Stress: Not on  file  ?Social Connections: Not on file  ?Intimate Partner Violence: Not on file  ? ? ?Review of Systems: ?See HPI, otherwise negative ROS ? ?Physical Exam: ?BP 129/80   Pulse 62   Temp (!) 96.8 ?F (36 ?C) (Temporal)   Resp 16   Ht '5\' 4"'$  (1.626 m)   Wt 72.6 kg   SpO2 100%   BMI 27.46 kg/m?  ?General:   Alert,  pleasant and cooperative in NAD ?Head:  Normocephalic and atraumatic. ?Neck:  Supple; no masses or thyromegaly. ?Lungs:  Clear throughout to auscultation, normal respiratory effort.    ?Heart:  +S1, +S2, Regular rate and rhythm, No edema. ?Abdomen:  Soft, nontender and nondistended. Normal bowel sounds, without guarding, and without rebound.   ?Neurologic:  Alert and  oriented x4;  grossly normal neurologically. ? ?Impression/Plan: ?Kevin Graham is here for an colonoscopy to be performed for Screening colonoscopy average risk   ?Risks, benefits, limitations, and alternatives  regarding  colonoscopy have been reviewed with the patient.  Questions have been answered.  All parties agreeable. ? ? ?[name], MD  01/29/2022, 9:03 AM ? ?

## 2022-01-30 LAB — SURGICAL PATHOLOGY

## 2022-02-10 ENCOUNTER — Encounter: Payer: Self-pay | Admitting: Gastroenterology

## 2022-05-06 ENCOUNTER — Other Ambulatory Visit: Payer: 59

## 2022-05-06 DIAGNOSIS — E782 Mixed hyperlipidemia: Secondary | ICD-10-CM

## 2022-07-16 ENCOUNTER — Other Ambulatory Visit: Payer: Self-pay | Admitting: Internal Medicine

## 2022-07-16 DIAGNOSIS — E782 Mixed hyperlipidemia: Secondary | ICD-10-CM

## 2022-07-16 NOTE — Telephone Encounter (Signed)
Unable to refill per protocol, Rx expired. Medication was discontinued 10/11/21. Will refuse request.   Requested Prescriptions  Pending Prescriptions Disp Refills  . simvastatin (ZOCOR) 40 MG tablet [Pharmacy Med Name: SIMVASTATIN '40MG'$  TABLETS] 90 tablet 1    Sig: TAKE 1 TABLET(40 MG) BY MOUTH DAILY AT 6 PM     Cardiovascular:  Antilipid - Statins Failed - 07/16/2022  7:07 AM      Failed - Lipid Panel in normal range within the last 12 months    Cholesterol  Date Value Ref Range Status  01/24/2022 206 (H) <200 mg/dL Final   LDL Cholesterol (Calc)  Date Value Ref Range Status  01/24/2022  mg/dL (calc) Final    Comment:    . LDL cholesterol not calculated. Triglyceride levels greater than 400 mg/dL invalidate calculated LDL results. . Reference range: <100 . Desirable range <100 mg/dL for primary prevention;   <70 mg/dL for patients with CHD or diabetic patients  with > or = 2 CHD risk factors. Marland Kitchen LDL-C is now calculated using the Martin-Hopkins  calculation, which is a validated novel method providing  better accuracy than the Friedewald equation in the  estimation of LDL-C.  Cresenciano Genre et al. Annamaria Helling. 8889;169(45): 2061-2068  (http://education.[facility].com/faq/FAQ164)    Direct LDL  Date Value Ref Range Status  02/16/2020 118.0 mg/dL Final    Comment:    Optimal:  <100 mg/dLNear or Above Optimal:  100-129 mg/dLBorderline High:  130-159 mg/dLHigh:  160-189 mg/dLVery High:  >190 mg/dL   HDL  Date Value Ref Range Status  01/24/2022 42 > OR = 40 mg/dL Final   Triglycerides  Date Value Ref Range Status  01/24/2022 776 (H) <150 mg/dL Final    Comment:    . If a non-fasting specimen was collected, consider repeat triglyceride testing on a fasting specimen if clinically indicated.  Yates Decamp et al. J. of Clin. Lipidol. 0388;8:280-034. . . There is increased risk of pancreatitis when the  triglyceride concentration is very high  (> or = 500 mg/dL, especially if >  or = 1000 mg/dL).  Yates Decamp et al. J. of Clin. Lipidol. 9179;1:505-697. Marland Kitchen          Passed - Patient is not pregnant      Passed - Valid encounter within last 12 months    Recent Outpatient Visits          6 months ago Encounter for general adult medical examination with abnormal findings   [facility] [place], Coralie Keens, NP   1 year ago Aortic atherosclerosis [facility])   [facility] [place], Coralie Keens, NP

## 2022-07-29 ENCOUNTER — Ambulatory Visit: Payer: 59 | Admitting: Internal Medicine

## 2022-07-29 ENCOUNTER — Encounter: Payer: Self-pay | Admitting: Internal Medicine

## 2022-07-29 ENCOUNTER — Other Ambulatory Visit: Payer: Self-pay | Admitting: Internal Medicine

## 2022-07-29 VITALS — BP 136/82 | HR 84 | Temp 97.1°F | Wt 165.0 lb

## 2022-07-29 DIAGNOSIS — J00 Acute nasopharyngitis [common cold]: Secondary | ICD-10-CM | POA: Diagnosis not present

## 2022-07-29 DIAGNOSIS — Z23 Encounter for immunization: Secondary | ICD-10-CM | POA: Diagnosis not present

## 2022-07-29 DIAGNOSIS — M545 Low back pain, unspecified: Secondary | ICD-10-CM

## 2022-07-29 DIAGNOSIS — E782 Mixed hyperlipidemia: Secondary | ICD-10-CM

## 2022-07-29 MED ORDER — PREDNISONE 10 MG PO TABS: ORAL_TABLET | ORAL | 0 refills | Status: DC

## 2022-07-29 MED ORDER — AZITHROMYCIN 250 MG PO TABS: ORAL_TABLET | ORAL | 0 refills | Status: DC

## 2022-07-29 MED ORDER — METHOCARBAMOL 500 MG PO TABS: 500.0000 mg | ORAL_TABLET | Freq: Three times a day (TID) | ORAL | 0 refills | Status: DC | PRN

## 2022-07-29 NOTE — Patient Instructions (Signed)

## 2022-07-29 NOTE — Telephone Encounter (Signed)
Requested Prescriptions  Pending Prescriptions Disp Refills  . ezetimibe (ZETIA) 10 MG tablet [Pharmacy Med Name: EZETIMIBE 10 MG TABLET] 90 tablet 1    Sig: TAKE 1 TABLET BY MOUTH EVERY DAY     Cardiovascular:  Antilipid - Sterol Transport Inhibitors Failed - 07/29/2022  1:53 AM      Failed - Lipid Panel in normal range within the last 12 months    Cholesterol  Date Value Ref Range Status  01/24/2022 206 (H) <200 mg/dL Final   LDL Cholesterol (Calc)  Date Value Ref Range Status  01/24/2022  mg/dL (calc) Final    Comment:    . LDL cholesterol not calculated. Triglyceride levels greater than 400 mg/dL invalidate calculated LDL results. . Reference range: <100 . Desirable range <100 mg/dL for primary prevention;   <70 mg/dL for patients with CHD or diabetic patients  with > or = 2 CHD risk factors. Marland Kitchen LDL-C is now calculated using the Martin-Hopkins  calculation, which is a validated novel method providing  better accuracy than the Friedewald equation in the  estimation of LDL-C.  Cresenciano Genre et al. Annamaria Helling. 9371;696(78): 2061-2068  (http://education.[facility].com/faq/FAQ164)    Direct LDL  Date Value Ref Range Status  02/16/2020 118.0 mg/dL Final    Comment:    Optimal:  <100 mg/dLNear or Above Optimal:  100-129 mg/dLBorderline High:  130-159 mg/dLHigh:  160-189 mg/dLVery High:  >190 mg/dL   HDL  Date Value Ref Range Status  01/24/2022 42 > OR = 40 mg/dL Final   Triglycerides  Date Value Ref Range Status  01/24/2022 776 (H) <150 mg/dL Final    Comment:    . If a non-fasting specimen was collected, consider repeat triglyceride testing on a fasting specimen if clinically indicated.  Yates Decamp et al. J. of Clin. Lipidol. 9381;0:175-102. . . There is increased risk of pancreatitis when the  triglyceride concentration is very high  (> or = 500 mg/dL, especially if > or = 1000 mg/dL).  Yates Decamp et al. J. of Clin. Lipidol. 5852;7:782-423. Marland Kitchen          Passed -  AST in normal range and within 360 days    AST  Date Value Ref Range Status  01/16/2022 25 10 - 35 U/L Final         Passed - ALT in normal range and within 360 days    ALT  Date Value Ref Range Status  01/16/2022 43 9 - 46 U/L Final         Passed - Patient is not pregnant      Passed - Valid encounter within last 12 months    Recent Outpatient Visits          Today Acute nasopharyngitis   [facility] [place], Coralie Keens, NP   6 months ago Encounter for general adult medical examination with abnormal findings   [facility] [place], Coralie Keens, NP   1 year ago Aortic atherosclerosis [facility])   [facility] [place], Coralie Keens, NP             . rosuvastatin (CRESTOR) 10 MG tablet [Pharmacy Med Name: ROSUVASTATIN CALCIUM 10 MG TAB] 90 tablet 1    Sig: TAKE 1 TABLET BY MOUTH EVERY DAY     Cardiovascular:  Antilipid - Statins 2 Failed - 07/29/2022  1:53 AM      Failed - Lipid Panel in normal range within the last 12 months    Cholesterol  Date Value Ref  Range Status  01/24/2022 206 (H) <200 mg/dL Final   LDL Cholesterol (Calc)  Date Value Ref Range Status  01/24/2022  mg/dL (calc) Final    Comment:    . LDL cholesterol not calculated. Triglyceride levels greater than 400 mg/dL invalidate calculated LDL results. . Reference range: <100 . Desirable range <100 mg/dL for primary prevention;   <70 mg/dL for patients with CHD or diabetic patients  with > or = 2 CHD risk factors. Marland Kitchen LDL-C is now calculated using the Martin-Hopkins  calculation, which is a validated novel method providing  better accuracy than the Friedewald equation in the  estimation of LDL-C.  Cresenciano Genre et al. Annamaria Helling. 2409;735(32): 2061-2068  (http://education.[facility].com/faq/FAQ164)    Direct LDL  Date Value Ref Range Status  02/16/2020 118.0 mg/dL Final    Comment:    Optimal:  <100 mg/dLNear or Above Optimal:  100-129 mg/dLBorderline High:  130-159  mg/dLHigh:  160-189 mg/dLVery High:  >190 mg/dL   HDL  Date Value Ref Range Status  01/24/2022 42 > OR = 40 mg/dL Final   Triglycerides  Date Value Ref Range Status  01/24/2022 776 (H) <150 mg/dL Final    Comment:    . If a non-fasting specimen was collected, consider repeat triglyceride testing on a fasting specimen if clinically indicated.  Yates Decamp et al. J. of Clin. Lipidol. 9924;2:683-419. . . There is increased risk of pancreatitis when the  triglyceride concentration is very high  (> or = 500 mg/dL, especially if > or = 1000 mg/dL).  Yates Decamp et al. J. of Clin. Lipidol. 6222;9:798-921. Marland Kitchen          Passed - Cr in normal range and within 360 days    Creat  Date Value Ref Range Status  01/16/2022 0.72 0.70 - 1.30 mg/dL Final         Passed - Patient is not pregnant      Passed - Valid encounter within last 12 months    Recent Outpatient Visits          Today Acute nasopharyngitis   [facility] [place], Coralie Keens, NP   6 months ago Encounter for general adult medical examination with abnormal findings   [facility] [place], Coralie Keens, NP   1 year ago Aortic atherosclerosis [facility])   [facility], Coralie Keens, NP

## 2022-07-29 NOTE — Progress Notes (Signed)
Subjective:    Patient ID: Kevin Graham, male    DOB: 18-Aug-1971, 51 y.o.   MRN: [id number]  HPI  Patient presents the clinic today with complaint of runny nose, ear fullness, sore throat and cough.  He reports this started 10 days ago. He is blowing white mucous out of his nose. He denies ear drainage or decreased hearing. He has had some difficulty swallowing. The cough is productive of yellow mucous. He denies headache, nasal congestion, shortness of breath, nausea, vomiting or diarrhea. He has tried Mucinex OTC with minimal relief of symptoms. He has not had sick contacts that he is aware of. He has not taken a covid test.  He also reports low back pain. This started 3 days ago. He describes the pain as tightness and pulling. The pain radiates up to his left shoulder. He denies numbness, tinging or weakness of his left upper or lower extremity. He denies any injury to the area. He has not taken anything OTC for this.   Review of Systems     Past Medical History:  Diagnosis Date   Environmental allergies    History of stomach ulcers    Seasonal allergies     Current Outpatient Medications  Medication Sig Dispense Refill   aspirin 81 MG EC tablet Take 1 tablet (81 mg total) by mouth daily. Swallow whole. 30 tablet 12   azelastine (OPTIVAR) 0.05 % ophthalmic solution Place 1 drop into both eyes daily as needed (for itchy/watery eyes). As needed 6 mL 5   cetirizine (ZYRTEC) 10 MG tablet Take 1 tablet (10 mg total) by mouth daily as needed for allergies or rhinitis. 34 tablet 1   EPINEPHrine 0.3 mg/0.3 mL IJ SOAJ injection Inject 0.3 mg into the muscle once.     ezetimibe (ZETIA) 10 MG tablet TAKE 1 TABLET(10 MG) BY MOUTH DAILY 90 tablet 1   famotidine (PEPCID) 10 MG tablet Take 1 tablet (10 mg total) by mouth 2 (two) times daily. 180 tablet 1   fluticasone (FLONASE) 50 MCG/ACT nasal spray SHAKE LIQUID AND USE 1 SPRAY IN EACH NOSTRIL TWICE DAILY AS NEEDED 16 g 0   naproxen sodium (ALEVE)  220 MG tablet Take 220 mg by mouth as needed.     rosuvastatin (CRESTOR) 10 MG tablet Take 1 tablet (10 mg total) by mouth daily. 90 tablet 1   No current facility-administered medications for this visit.    No Known Allergies  Family History  Problem Relation Age of Onset   Hyperlipidemia Mother     Social History   Socioeconomic History   Marital status: Single    Spouse name: Not on file   Number of children: Not on file   Years of education: Not on file   Highest education level: Not on file  Occupational History   Not on file  Tobacco Use   Smoking status: Every Day    Packs/day: 0.25    Years: 38.00    Total pack years: 9.50    Types: Cigarettes   Smokeless tobacco: Never   Tobacco comments:    2 packs per week  Vaping Use   Vaping Use: Never used  Substance and Sexual Activity   Alcohol use: Yes    Alcohol/week: 3.0 standard drinks of alcohol    Types: 3 Cans of beer per week    Comment: 1-3 beers during the week, more on the weekends   Drug use: No   Sexual activity: Yes    Partners:  Female  Other Topics Concern   Not on file  Social History Narrative   Lives with girlfriend and her child in a one bedroom home.  Has 5 children.  Works at [employer].  Education: high school.   Social Determinants of Health   Financial Resource Strain: Not on file  Food Insecurity: Not on file  Transportation Needs: Not on file  Physical Activity: Not on file  Stress: Not on file  Social Connections: Not on file  Intimate Partner Violence: Not on file     Constitutional: Denies fever, malaise, fatigue, headache or abrupt weight changes.  HEENT: Patient reports runny nose and sore throat.  Denies eye pain, eye redness, ear pain, ringing in the ears, wax buildup, nasal congestion, bloody nose. Respiratory: Patient reports cough.  Denies difficulty breathing, shortness of breath.   Cardiovascular: Denies chest pain, chest tightness, palpitations or swelling in the hands  or feet.  Gastrointestinal: Denies abdominal pain, bloating, constipation, diarrhea or blood in the stool.  GU: Denies urgency, frequency, pain with urination, burning sensation, blood in urine, odor or discharge. Musculoskeletal: Patient reports low back pain.  Denies decrease in range of motion, difficulty with gait, or joint swelling.  Skin: Denies redness, rashes, lesions or ulcercations.  Neurological: Denies dizziness, difficulty with memory, difficulty with speech or problems with balance and coordination.    No other specific complaints in a complete review of systems (except as listed in HPI above).  Objective:   Physical Exam  BP 136/82 (BP Location: Right Arm, Patient Position: Sitting, Cuff Size: Normal)   Pulse 84   Temp (!) 97.1 F (36.2 C) (Temporal)   Wt 165 lb (74.8 kg)   SpO2 99%   BMI 28.32 kg/m   Wt Readings from Last 3 Encounters:  01/29/22 160 lb (72.6 kg)  01/16/22 164 lb (74.4 kg)  07/11/21 160 lb 3.2 oz (72.7 kg)    General: Appears his stated age, overweight in NAD. Skin: Warm, dry and intact. No rashes noted. HEENT: Head: normal shape and size, no sinus tenderness noted; Eyes: sclera white, no icterus, conjunctiva pink, PERRLA and EOMs intact; Ears: Tm's gray and intact, normal light reflex; Nose: mucosa pink and moist, septum midline; Throat/Mouth: Teeth present, mucosa is and moist, + PND, no exudate, lesions or ulcerations noted.  Neck:  Neck supple, trachea midline. No masses, lumps or thyromegaly present.  Cardiovascular: Normal rate and rhythm. S1,S2 noted.  No murmur, rubs or gallops noted. Pulmonary/Chest: Normal effort and positive vesicular breath sounds. No respiratory distress. No wheezes, rales or ronchi noted.  Musculoskeletal: Decreased flexion and rotation secondary to pain.  Normal extension and lateral bending.  No bony tenderness noted over the lumbar spine.  Pain with palpation of the left paralumbar muscles.  Normal internal and  external rotation of the left shoulder.  Strength 5/5 BUE/BLE.  No difficulty with gait.  Neurological: Alert and oriented.   BMET    Component Value Date/Time   NA 139 01/16/2022 0837   K 4.0 01/16/2022 0837   CL 108 01/16/2022 0837   CO2 22 01/16/2022 0837   GLUCOSE 108 (H) 01/16/2022 0837   BUN 16 01/16/2022 0837   CREATININE 0.72 01/16/2022 0837   CALCIUM 9.7 01/16/2022 0837   GFRNONAA >60 04/06/2017 2018   GFRAA >60 04/06/2017 2018    Lipid Panel     Component Value Date/Time   CHOL 206 (H) 01/24/2022 0833   TRIG 776 (H) 01/24/2022 0833   HDL 42 01/24/2022  4580   CHOLHDL 4.9 01/24/2022 0833   VLDL 41.8 (H) 02/16/2020 0825   LDLCALC  01/24/2022 9983     Comment:     . LDL cholesterol not calculated. Triglyceride levels greater than 400 mg/dL invalidate calculated LDL results. . Reference range: <100 . Desirable range <100 mg/dL for primary prevention;   <70 mg/dL for patients with CHD or diabetic patients  with > or = 2 CHD risk factors. Marland Kitchen LDL-C is now calculated using the Martin-Hopkins  calculation, which is a validated novel method providing  better accuracy than the Friedewald equation in the  estimation of LDL-C.  Cresenciano Genre et al. Annamaria Helling. 3825;053(97): 2061-2068  (http://education.[facility].com/faq/FAQ164)     CBC    Component Value Date/Time   WBC 5.5 01/24/2022 0833   RBC 5.06 01/24/2022 0833   HGB 16.4 01/24/2022 0833   HCT 49.1 01/24/2022 0833   PLT 177 01/24/2022 0833   MCV 97.0 01/24/2022 0833   MCH 32.4 01/24/2022 0833   MCHC 33.4 01/24/2022 0833   RDW 12.5 01/24/2022 0833    Hgb A1C Lab Results  Component Value Date   HGBA1C 5.5 07/09/2021           Assessment & Plan:   URI:  Encourage rest and fluids Start taking Flonase OTC Rx for azithromycin 250 mg daily x5 days Okay to take Delsym OTC as needed for cough  Left  Side Low Back Pain:  Encourage stretching Alternate heat and ice Rx for Pred taper x6 days Rx  for methocarbamol 500 mg every 8 hours-sedation caution given  Schedule appointment for follow-up of chronic conditions Webb Silversmith, NP

## 2022-08-12 ENCOUNTER — Encounter: Payer: Self-pay | Admitting: Internal Medicine

## 2022-08-12 ENCOUNTER — Ambulatory Visit: Payer: 59 | Admitting: Internal Medicine

## 2022-08-12 VITALS — BP 136/82 | HR 73 | Temp 97.1°F | Wt 163.0 lb

## 2022-08-12 DIAGNOSIS — I700 Atherosclerosis of aorta: Secondary | ICD-10-CM | POA: Diagnosis not present

## 2022-08-12 DIAGNOSIS — R052 Subacute cough: Secondary | ICD-10-CM | POA: Diagnosis not present

## 2022-08-12 DIAGNOSIS — Z6827 Body mass index (BMI) 27.0-27.9, adult: Secondary | ICD-10-CM

## 2022-08-12 DIAGNOSIS — K219 Gastro-esophageal reflux disease without esophagitis: Secondary | ICD-10-CM | POA: Diagnosis not present

## 2022-08-12 DIAGNOSIS — E782 Mixed hyperlipidemia: Secondary | ICD-10-CM

## 2022-08-12 DIAGNOSIS — E663 Overweight: Secondary | ICD-10-CM | POA: Diagnosis not present

## 2022-08-12 MED ORDER — BENZONATATE 200 MG PO CAPS: 200.0000 mg | ORAL_CAPSULE | Freq: Three times a day (TID) | ORAL | 1 refills | Status: DC | PRN

## 2022-08-12 NOTE — Progress Notes (Signed)
Subjective:    Patient ID: Kevin Graham, male    DOB: 1970-10-14, 51 y.o.   MRN: [id number]  HPI  Patient presents to clinic today with complaint of persistent cough.  He reports the cough is mostly dry and nonproductive.  He reports associated pressure in his head and pressure in his chest but only when he coughs.  He denies headache, runny nose, nasal congestion, ear pain, sore throat, shortness of breath.  He was seen 10/31 for the same, treated for a URI with Azithromycin and Prednisone.  GERD: He is not sure what triggers this.  He denies breakthrough on Famotidine.  Upper GI from 01/2022 reviewed.  HLD with Aortic Atherosclerosis: His last LDL was not calculated, triglycerides 776, 01/2022.  He denies myalgias on Rosuvastatin.  He is taking Aspirin as well.  He does not consume a low-fat diet.  Review of Systems     Past Medical History:  Diagnosis Date   Environmental allergies    History of stomach ulcers    Seasonal allergies     Current Outpatient Medications  Medication Sig Dispense Refill   aspirin 81 MG EC tablet Take 1 tablet (81 mg total) by mouth daily. Swallow whole. (Patient not taking: Reported on 07/29/2022) 30 tablet 12   azelastine (OPTIVAR) 0.05 % ophthalmic solution Place 1 drop into both eyes daily as needed (for itchy/watery eyes). As needed 6 mL 5   azithromycin (ZITHROMAX) 250 MG tablet Take 2 tabs today, then 1 tab daily x 4 days 6 tablet 0   cetirizine (ZYRTEC) 10 MG tablet Take 1 tablet (10 mg total) by mouth daily as needed for allergies or rhinitis. 34 tablet 1   EPINEPHrine 0.3 mg/0.3 mL IJ SOAJ injection Inject 0.3 mg into the muscle once.     ezetimibe (ZETIA) 10 MG tablet TAKE 1 TABLET BY MOUTH EVERY DAY 90 tablet 1   famotidine (PEPCID) 10 MG tablet Take 1 tablet (10 mg total) by mouth 2 (two) times daily. 180 tablet 1   fluticasone (FLONASE) 50 MCG/ACT nasal spray SHAKE LIQUID AND USE 1 SPRAY IN EACH NOSTRIL TWICE DAILY AS NEEDED 16 g 0    methocarbamol (ROBAXIN) 500 MG tablet Take 1 tablet (500 mg total) by mouth every 8 (eight) hours as needed for muscle spasms. 15 tablet 0   naproxen sodium (ALEVE) 220 MG tablet Take 220 mg by mouth as needed.     predniSONE (DELTASONE) 10 MG tablet Take 6 tabs on day 1, 5 tabs on day 2, 4 tabs on day 3, 3 tabs on day 4, 2 tabs on day 5, 1 tab on day 6 21 tablet 0   rosuvastatin (CRESTOR) 10 MG tablet TAKE 1 TABLET BY MOUTH EVERY DAY 90 tablet 1   No current facility-administered medications for this visit.    No Known Allergies  Family History  Problem Relation Age of Onset   Hyperlipidemia Mother     Social History   Socioeconomic History   Marital status: Single    Spouse name: Not on file   Number of children: Not on file   Years of education: Not on file   Highest education level: Not on file  Occupational History   Not on file  Tobacco Use   Smoking status: Every Day    Packs/day: 0.25    Years: 38.00    Total pack years: 9.50    Types: Cigarettes   Smokeless tobacco: Never   Tobacco comments:    2  packs per week  Vaping Use   Vaping Use: Never used  Substance and Sexual Activity   Alcohol use: Yes    Alcohol/week: 3.0 standard drinks of alcohol    Types: 3 Cans of beer per week    Comment: 1-3 beers during the week, more on the weekends   Drug use: No   Sexual activity: Yes    Partners: Female  Other Topics Concern   Not on file  Social History Narrative   Lives with girlfriend and her child in a one bedroom home.  Has 5 children.  Works at [employer].  Education: high school.   Social Determinants of Health   Financial Resource Strain: Not on file  Food Insecurity: Not on file  Transportation Needs: Not on file  Physical Activity: Not on file  Stress: Not on file  Social Connections: Not on file  Intimate Partner Violence: Not on file     Constitutional: Denies fever, malaise, fatigue, headache or abrupt weight changes.  HEENT: Denies eye pain,  eye redness, ear pain, ringing in the ears, wax buildup, runny nose, nasal congestion, bloody nose, or sore throat. Respiratory: Patient reports cough.  Denies difficulty breathing, shortness of breath, or sputum production.   Cardiovascular: Patient reports chest pressure when coughs.  Denies chest pain, chest tightness, palpitations or swelling in the hands or feet.  Gastrointestinal: Denies abdominal pain, bloating, constipation, diarrhea or blood in the stool.  Musculoskeletal: Denies decrease in range of motion, difficulty with gait, muscle pain or joint pain and swelling.  Skin: Denies redness, rashes, lesions or ulcercations.  Neurological: Denies dizziness, difficulty with memory, difficulty with speech or problems with balance and coordination.   No other specific complaints in a complete review of systems (except as listed in HPI above).  Objective:   Physical Exam BP 136/82 (BP Location: Left Arm, Patient Position: Sitting, Cuff Size: Normal)   Pulse 73   Temp (!) 97.1 F (36.2 C) (Temporal)   Wt 163 lb (73.9 kg)   SpO2 99%   BMI 27.98 kg/m   Wt Readings from Last 3 Encounters:  07/29/22 165 lb (74.8 kg)  01/29/22 160 lb (72.6 kg)  01/16/22 164 lb (74.4 kg)    General: Appears his stated age, overweight, in NAD. Skin: Warm, dry and intact.  HEENT: Head: normal shape and size; Eyes: sclera white, no icterus, conjunctiva pink, PERRLA and EOMs intact; Ears: Tm's gray and intact, normal light reflex; Throat/Mouth: Teeth present, mucosa pink and moist, no exudate, lesions or ulcerations noted.  Neck: No adenopathy noted. Cardiovascular: Normal rate and rhythm. S1,S2 noted.  No murmur, rubs or gallops noted. No JVD or BLE edema. No carotid bruits noted. Pulmonary/Chest: Normal effort and positive vesicular breath sounds. No respiratory distress. No wheezes, rales or ronchi noted.  Abdomen: Soft and nontender. Normal bowel sounds.  Musculoskeletal: No difficulty with gait.   Neurological: Alert and oriented.    BMET    Component Value Date/Time   NA 139 01/16/2022 0837   K 4.0 01/16/2022 0837   CL 108 01/16/2022 0837   CO2 22 01/16/2022 0837   GLUCOSE 108 (H) 01/16/2022 0837   BUN 16 01/16/2022 0837   CREATININE 0.72 01/16/2022 0837   CALCIUM 9.7 01/16/2022 0837   GFRNONAA >60 04/06/2017 2018   GFRAA >60 04/06/2017 2018    Lipid Panel     Component Value Date/Time   CHOL 206 (H) 01/24/2022 0833   TRIG 776 (H) 01/24/2022 0833   HDL  42 01/24/2022 0833   CHOLHDL 4.9 01/24/2022 0833   VLDL 41.8 (H) 02/16/2020 0825   LDLCALC  01/24/2022 9774     Comment:     . LDL cholesterol not calculated. Triglyceride levels greater than 400 mg/dL invalidate calculated LDL results. . Reference range: <100 . Desirable range <100 mg/dL for primary prevention;   <70 mg/dL for patients with CHD or diabetic patients  with > or = 2 CHD risk factors. Marland Kitchen LDL-C is now calculated using the Martin-Hopkins  calculation, which is a validated novel method providing  better accuracy than the Friedewald equation in the  estimation of LDL-C.  Cresenciano Genre et al. Annamaria Helling. 1423;953(20): 2061-2068  (http://education.[facility].com/faq/FAQ164)     CBC    Component Value Date/Time   WBC 5.5 01/24/2022 0833   RBC 5.06 01/24/2022 0833   HGB 16.4 01/24/2022 0833   HCT 49.1 01/24/2022 0833   PLT 177 01/24/2022 0833   MCV 97.0 01/24/2022 0833   MCH 32.4 01/24/2022 0833   MCHC 33.4 01/24/2022 0833   RDW 12.5 01/24/2022 0833    Hgb A1C Lab Results  Component Value Date   HGBA1C 5.5 07/09/2021           Assessment & Plan:   Cough:  Likely postinfectious No indication for repeat antibiotics or steroids at this time Rx for Tessalon 200 mg every 8 hours as needed  RTC in 6 months for your annual exam Webb Silversmith, NP

## 2022-08-12 NOTE — Assessment & Plan Note (Signed)
C-Met and lipid profile today Encouraged him to consume a low-fat diet Continue rosuvastatin and aspirin

## 2022-08-12 NOTE — Assessment & Plan Note (Signed)
C-Met and lipid profile today Encouraged him to consume a low-fat diet Continue rosuvastatin and aspirin 

## 2022-08-12 NOTE — Patient Instructions (Signed)

## 2022-08-12 NOTE — Assessment & Plan Note (Signed)
Encourage diet and exercise for weight loss 

## 2022-08-12 NOTE — Assessment & Plan Note (Signed)
Try to identify and avoid foods that trigger reflux Encourage weight loss as this can produce reflux symptoms Continue famotidine

## 2022-08-13 LAB — COMPLETE METABOLIC PANEL WITH GFR
AG Ratio: 1.9 (calc) (ref 1.0–2.5)
ALT: 49 U/L — ABNORMAL HIGH (ref 9–46)
AST: 25 U/L (ref 10–35)
Albumin: 4.4 g/dL (ref 3.6–5.1)
Alkaline phosphatase (APISO): 72 U/L (ref 35–144)
BUN: 15 mg/dL (ref 7–25)
CO2: 23 mmol/L (ref 20–32)
Calcium: 9.7 mg/dL (ref 8.6–10.3)
Chloride: 106 mmol/L (ref 98–110)
Creat: 0.86 mg/dL (ref 0.70–1.30)
Globulin: 2.3 g/dL (calc) (ref 1.9–3.7)
Glucose, Bld: 120 mg/dL — ABNORMAL HIGH (ref 65–99)
Potassium: 4 mmol/L (ref 3.5–5.3)
Sodium: 138 mmol/L (ref 135–146)
Total Bilirubin: 0.4 mg/dL (ref 0.2–1.2)
Total Protein: 6.7 g/dL (ref 6.1–8.1)
eGFR: 105 mL/min/{1.73_m2} (ref >=60)

## 2022-08-13 LAB — LIPID PANEL
Cholesterol: 196 mg/dL (ref <200)
HDL: 41 mg/dL (ref >=40)
Non-HDL Cholesterol (Calc): 155 mg/dL (calc) — ABNORMAL HIGH (ref <130)
Total CHOL/HDL Ratio: 4.8 (calc) (ref <5.0)
Triglycerides: 1151 mg/dL — ABNORMAL HIGH (ref <150)

## 2023-01-19 ENCOUNTER — Encounter: Payer: Self-pay | Admitting: Internal Medicine

## 2023-01-19 ENCOUNTER — Ambulatory Visit (INDEPENDENT_AMBULATORY_CARE_PROVIDER_SITE_OTHER): Payer: 59 | Admitting: Internal Medicine

## 2023-01-19 VITALS — BP 133/84 | HR 69 | Temp 97.8°F | Resp 18 | Ht 64.0 in | Wt 162.0 lb

## 2023-01-19 DIAGNOSIS — Z125 Encounter for screening for malignant neoplasm of prostate: Secondary | ICD-10-CM

## 2023-01-19 DIAGNOSIS — Z0001 Encounter for general adult medical examination with abnormal findings: Secondary | ICD-10-CM | POA: Diagnosis not present

## 2023-01-19 DIAGNOSIS — E663 Overweight: Secondary | ICD-10-CM | POA: Diagnosis not present

## 2023-01-19 DIAGNOSIS — R7309 Other abnormal glucose: Secondary | ICD-10-CM

## 2023-01-19 DIAGNOSIS — Z6827 Body mass index (BMI) 27.0-27.9, adult: Secondary | ICD-10-CM | POA: Diagnosis not present

## 2023-01-19 MED ORDER — ASPIRIN 81 MG PO TBEC: 81.0000 mg | DELAYED_RELEASE_TABLET | Freq: Every day | ORAL | 12 refills | Status: DC

## 2023-01-19 NOTE — Assessment & Plan Note (Signed)
Encouraged diet and exercise for weight loss ?

## 2023-01-19 NOTE — Progress Notes (Signed)
Subjective:    Patient ID: Kevin Graham, male    DOB: 1971/07/06, 52 y.o.   MRN: [id number]  HPI  Patient presents to clinic today for his annual exam.  Flu: 06/2022 Tetanus: 05/2016 COVID: Moderna x 2 Shingrix: Never PSA screening: 06/2021 Colon screening: 01/2022 Vision screening: as needed Dentist: biannually  Diet: He does eat meat. He consumes fruits and veggies. He does eat fried foods. He drinks mostly water. Exercise: None  Review of Systems     Past Medical History:  Diagnosis Date   Environmental allergies    History of stomach ulcers    Seasonal allergies     Current Outpatient Medications  Medication Sig Dispense Refill   aspirin 81 MG EC tablet Take 1 tablet (81 mg total) by mouth daily. Swallow whole. (Patient not taking: Reported on 08/12/2022) 30 tablet 12   azelastine (OPTIVAR) 0.05 % ophthalmic solution Place 1 drop into both eyes daily as needed (for itchy/watery eyes). As needed 6 mL 5   azithromycin (ZITHROMAX) 250 MG tablet Take 2 tabs today, then 1 tab daily x 4 days 6 tablet 0   benzonatate (TESSALON) 200 MG capsule Take 1 capsule (200 mg total) by mouth 3 (three) times daily as needed for cough. 30 capsule 1   cetirizine (ZYRTEC) 10 MG tablet Take 1 tablet (10 mg total) by mouth daily as needed for allergies or rhinitis. 34 tablet 1   EPINEPHrine 0.3 mg/0.3 mL IJ SOAJ injection Inject 0.3 mg into the muscle once.     ezetimibe (ZETIA) 10 MG tablet TAKE 1 TABLET BY MOUTH EVERY DAY 90 tablet 1   famotidine (PEPCID) 10 MG tablet Take 1 tablet (10 mg total) by mouth 2 (two) times daily. 180 tablet 1   fluticasone (FLONASE) 50 MCG/ACT nasal spray SHAKE LIQUID AND USE 1 SPRAY IN EACH NOSTRIL TWICE DAILY AS NEEDED 16 g 0   methocarbamol (ROBAXIN) 500 MG tablet Take 1 tablet (500 mg total) by mouth every 8 (eight) hours as needed for muscle spasms. 15 tablet 0   naproxen sodium (ALEVE) 220 MG tablet Take 220 mg by mouth as needed.     predniSONE (DELTASONE)  10 MG tablet Take 6 tabs on day 1, 5 tabs on day 2, 4 tabs on day 3, 3 tabs on day 4, 2 tabs on day 5, 1 tab on day 6 21 tablet 0   rosuvastatin (CRESTOR) 10 MG tablet TAKE 1 TABLET BY MOUTH EVERY DAY 90 tablet 1   No current facility-administered medications for this visit.    No Known Allergies  Family History  Problem Relation Age of Onset   Hyperlipidemia Mother     Social History   Socioeconomic History   Marital status: Single    Spouse name: Not on file   Number of children: Not on file   Years of education: Not on file   Highest education level: Not on file  Occupational History   Not on file  Tobacco Use   Smoking status: Every Day    Packs/day: 0.25    Years: 38.00    Additional pack years: 0.00    Total pack years: 9.50    Types: Cigarettes   Smokeless tobacco: Never   Tobacco comments:    2 packs per week  Vaping Use   Vaping Use: Never used  Substance and Sexual Activity   Alcohol use: Yes    Alcohol/week: 3.0 standard drinks of alcohol    Types: 3 Cans of  beer per week    Comment: 1-3 beers during the week, more on the weekends   Drug use: No   Sexual activity: Yes    Partners: Female  Other Topics Concern   Not on file  Social History Narrative   Lives with girlfriend and her child in a one bedroom home.  Has 5 children.  Works at [employer].  Education: high school.   Social Determinants of Health   Financial Resource Strain: Not on file  Food Insecurity: Not on file  Transportation Needs: Not on file  Physical Activity: Not on file  Stress: Not on file  Social Connections: Not on file  Intimate Partner Violence: Not on file     Constitutional: Denies fever, malaise, fatigue, headache or abrupt weight changes.  HEENT: Denies eye pain, eye redness, ear pain, ringing in the ears, wax buildup, runny nose, nasal congestion, bloody nose, or sore throat. Respiratory: Denies difficulty breathing, shortness of breath, cough or sputum production.    Cardiovascular: Denies chest pain, chest tightness, palpitations or swelling in the hands or feet.  Gastrointestinal: Denies abdominal pain, bloating, constipation, diarrhea or blood in the stool.  GU: Denies urgency, frequency, pain with urination, burning sensation, blood in urine, odor or discharge. Musculoskeletal: Denies decrease in range of motion, difficulty with gait, muscle pain or joint pain and swelling.  Skin: Denies redness, rashes, lesions or ulcercations.  Neurological: Denies dizziness, difficulty with memory, difficulty with speech or problems with balance and coordination.  Psych: Denies anxiety, depression, SI/HI.  No other specific complaints in a complete review of systems (except as listed in HPI above).  Objective:   Physical Exam   BP 133/84 (BP Location: Right Arm, Patient Position: Sitting, Cuff Size: Normal)   Pulse 69   Temp 97.8 F (36.6 C) (Oral)   Resp 18   Ht 5\' 4"  (1.626 m)   Wt 162 lb (73.5 kg)   BMI 27.81 kg/m   Wt Readings from Last 3 Encounters:  08/12/22 163 lb (73.9 kg)  07/29/22 165 lb (74.8 kg)  01/29/22 160 lb (72.6 kg)    General: Appears his stated age, overweight, in NAD. Skin: Warm, dry and intact.  HEENT: Head: normal shape and size; Eyes: sclera white, no icterus, conjunctiva pink, PERRLA and EOMs intact;  Neck:  Neck supple, trachea midline. No masses, lumps or thyromegaly present.  Cardiovascular: Normal rate and rhythm. S1,S2 noted.  No murmur, rubs or gallops noted. No JVD or BLE edema. No carotid bruits noted. Pulmonary/Chest: Normal effort and positive vesicular breath sounds. No respiratory distress. No wheezes, rales or ronchi noted.  Abdomen: Normal bowel sounds.  Musculoskeletal: Strength 5/5 BUE/BLE. No difficulty with gait.  Neurological: Alert and oriented. Cranial nerves II-XII grossly intact. Coordination normal.  Psychiatric: Mood and affect normal. Behavior is normal. Judgment and thought content normal.     BMET    Component Value Date/Time   NA 138 08/12/2022 1413   K 4.0 08/12/2022 1413   CL 106 08/12/2022 1413   CO2 23 08/12/2022 1413   GLUCOSE 120 (H) 08/12/2022 1413   BUN 15 08/12/2022 1413   CREATININE 0.86 08/12/2022 1413   CALCIUM 9.7 08/12/2022 1413   GFRNONAA >60 04/06/2017 2018   GFRAA >60 04/06/2017 2018    Lipid Panel     Component Value Date/Time   CHOL 196 08/12/2022 1413   TRIG 1,151 (H) 08/12/2022 1413   HDL 41 08/12/2022 1413   CHOLHDL 4.8 08/12/2022 1413   VLDL  41.8 (H) 02/16/2020 0825   LDLCALC  08/12/2022 1413     Comment:     . LDL cholesterol not calculated. Triglyceride levels greater than 400 mg/dL invalidate calculated LDL results. . Reference range: <100 . Desirable range <100 mg/dL for primary prevention;   <70 mg/dL for patients with CHD or diabetic patients  with > or = 2 CHD risk factors. Marland Kitchen LDL-C is now calculated using the Martin-Hopkins  calculation, which is a validated novel method providing  better accuracy than the Friedewald equation in the  estimation of LDL-C.  Horald Pollen et al. Lenox Ahr. 5784;696(29): 2061-2068  (http://education.[facility].com/faq/FAQ164)     CBC    Component Value Date/Time   WBC 5.5 01/24/2022 0833   RBC 5.06 01/24/2022 0833   HGB 16.4 01/24/2022 0833   HCT 49.1 01/24/2022 0833   PLT 177 01/24/2022 0833   MCV 97.0 01/24/2022 0833   MCH 32.4 01/24/2022 0833   MCHC 33.4 01/24/2022 0833   RDW 12.5 01/24/2022 0833    Hgb A1C Lab Results  Component Value Date   HGBA1C 5.5 07/09/2021          Assessment & Plan:   Preventative Health Maintenance:  Encouraged him to get a flu shot in the fall Tetanus UTD Encouraged him to get his COVID booster Discussed Shingrix vaccine, he will check coverage with his insurance company and schedule a visit if he would like to have this done Colon screening UTD Encouraged him to consume a balanced diet and exercise regimen Advised him to see an eye  doctor and dentist annually We will check CBC, c-Met, lipid, A1c and PSA today  RTC in 6 months, follow-up chronic conditions Nicki Reaper, NP

## 2023-01-19 NOTE — Patient Instructions (Signed)
Health Maintenance, Male Adopting a healthy lifestyle and getting preventive care are important in promoting health and wellness. Ask your health care provider about: The right schedule for you to have regular tests and exams. Things you can do on your own to prevent diseases and keep yourself healthy. What should I know about diet, weight, and exercise? Eat a healthy diet  Eat a diet that includes plenty of vegetables, fruits, low-fat dairy products, and lean protein. Do not eat a lot of foods that are high in solid fats, added sugars, or sodium. Maintain a healthy weight Body mass index (BMI) is a measurement that can be used to identify possible weight problems. It estimates body fat based on height and weight. Your health care provider can help determine your BMI and help you achieve or maintain a healthy weight. Get regular exercise Get regular exercise. This is one of the most important things you can do for your health. Most adults should: Exercise for at least 150 minutes each week. The exercise should increase your heart rate and make you sweat (moderate-intensity exercise). Do strengthening exercises at least twice a week. This is in addition to the moderate-intensity exercise. Spend less time sitting. Even light physical activity can be beneficial. Watch cholesterol and blood lipids Have your blood tested for lipids and cholesterol at 52 years of age, then have this test every 5 years. You may need to have your cholesterol levels checked more often if: Your lipid or cholesterol levels are high. You are older than 52 years of age. You are at high risk for heart disease. What should I know about cancer screening? Many types of cancers can be detected early and may often be prevented. Depending on your health history and family history, you may need to have cancer screening at various ages. This may include screening for: Colorectal cancer. Prostate cancer. Skin cancer. Lung  cancer. What should I know about heart disease, diabetes, and high blood pressure? Blood pressure and heart disease High blood pressure causes heart disease and increases the risk of stroke. This is more likely to develop in people who have high blood pressure readings or are overweight. Talk with your health care provider about your target blood pressure readings. Have your blood pressure checked: Every 3-5 years if you are 18-39 years of age. Every year if you are 40 years old or older. If you are between the ages of 65 and 75 and are a current or former smoker, ask your health care provider if you should have a one-time screening for abdominal aortic aneurysm (AAA). Diabetes Have regular diabetes screenings. This checks your fasting blood sugar level. Have the screening done: Once every three years after age 45 if you are at a normal weight and have a low risk for diabetes. More often and at a younger age if you are overweight or have a high risk for diabetes. What should I know about preventing infection? Hepatitis B If you have a higher risk for hepatitis B, you should be screened for this virus. Talk with your health care provider to find out if you are at risk for hepatitis B infection. Hepatitis C Blood testing is recommended for: Everyone born from 1945 through 1965. Anyone with known risk factors for hepatitis C. Sexually transmitted infections (STIs) You should be screened each year for STIs, including gonorrhea and chlamydia, if: You are sexually active and are younger than 52 years of age. You are older than 52 years of age and your   health care provider tells you that you are at risk for this type of infection. Your sexual activity has changed since you were last screened, and you are at increased risk for chlamydia or gonorrhea. Ask your health care provider if you are at risk. Ask your health care provider about whether you are at high risk for HIV. Your health care provider  may recommend a prescription medicine to help prevent HIV infection. If you choose to take medicine to prevent HIV, you should first get tested for HIV. You should then be tested every 3 months for as long as you are taking the medicine. Follow these instructions at home: Alcohol use Do not drink alcohol if your health care provider tells you not to drink. If you drink alcohol: Limit how much you have to 0-2 drinks a day. Know how much alcohol is in your drink. In the [place] one drink equals one 12 oz bottle of beer (355 mL), one 5 oz glass of wine (148 mL), or one 1 oz glass of hard liquor (44 mL). Lifestyle Do not use any products that contain nicotine or tobacco. These products include cigarettes, chewing tobacco, and vaping devices, such as e-cigarettes. If you need help quitting, ask your health care provider. Do not use street drugs. Do not share needles. Ask your health care provider for help if you need support or information about quitting drugs. General instructions Schedule regular health, dental, and eye exams. Stay current with your vaccines. Tell your health care provider if: You often feel depressed. You have ever been abused or do not feel safe at home. Summary Adopting a healthy lifestyle and getting preventive care are important in promoting health and wellness. Follow your health care provider's instructions about healthy diet, exercising, and getting tested or screened for diseases. Follow your health care provider's instructions on monitoring your cholesterol and blood pressure. This information is not intended to replace advice given to you by your health care provider. Make sure you discuss any questions you have with your health care provider. Document Revised: 02/04/2021 Document Reviewed: 02/04/2021 Elsevier Patient Education  2023 Elsevier Inc.  

## 2023-01-20 LAB — COMPLETE METABOLIC PANEL WITH GFR
AG Ratio: 1.8 (calc) (ref 1.0–2.5)
ALT: 23 U/L (ref 9–46)
AST: 15 U/L (ref 10–35)
Albumin: 4.6 g/dL (ref 3.6–5.1)
Alkaline phosphatase (APISO): 56 U/L (ref 35–144)
BUN: 16 mg/dL (ref 7–25)
CO2: 28 mmol/L (ref 20–32)
Calcium: 10.4 mg/dL — ABNORMAL HIGH (ref 8.6–10.3)
Chloride: 106 mmol/L (ref 98–110)
Creat: 0.87 mg/dL (ref 0.70–1.30)
Globulin: 2.5 g/dL (calc) (ref 1.9–3.7)
Glucose, Bld: 108 mg/dL — ABNORMAL HIGH (ref 65–99)
Potassium: 4.2 mmol/L (ref 3.5–5.3)
Sodium: 140 mmol/L (ref 135–146)
Total Bilirubin: 0.4 mg/dL (ref 0.2–1.2)
Total Protein: 7.1 g/dL (ref 6.1–8.1)
eGFR: 104 mL/min/{1.73_m2} (ref >=60)

## 2023-01-20 LAB — CBC
HCT: 46.1 % (ref 38.5–50.0)
Hemoglobin: 15.1 g/dL (ref 13.2–17.1)
MCH: 30.9 pg (ref 27.0–33.0)
MCHC: 32.8 g/dL (ref 32.0–36.0)
MCV: 94.5 fL (ref 80.0–100.0)
MPV: 10.1 fL (ref 7.5–12.5)
Platelets: 167 10*3/uL (ref 140–400)
RBC: 4.88 10*6/uL (ref 4.20–5.80)
RDW: 12.6 % (ref 11.0–15.0)
WBC: 4.8 10*3/uL (ref 3.8–10.8)

## 2023-01-20 LAB — HEMOGLOBIN A1C
Hgb A1c MFr Bld: 6 % of total Hgb — ABNORMAL HIGH (ref <5.7)
Mean Plasma Glucose: 126 mg/dL
eAG (mmol/L): 7 mmol/L

## 2023-01-20 LAB — LIPID PANEL
Cholesterol: 258 mg/dL — ABNORMAL HIGH (ref <200)
HDL: 42 mg/dL (ref >=40)
Non-HDL Cholesterol (Calc): 216 mg/dL (calc) — ABNORMAL HIGH (ref <130)
Total CHOL/HDL Ratio: 6.1 (calc) — ABNORMAL HIGH (ref <5.0)
Triglycerides: 528 mg/dL — ABNORMAL HIGH (ref <150)

## 2023-01-20 LAB — PSA: PSA: 2.48 ng/mL (ref <=4.00)

## 2023-03-03 ENCOUNTER — Ambulatory Visit: Payer: 59 | Admitting: Internal Medicine

## 2023-03-03 ENCOUNTER — Encounter: Payer: Self-pay | Admitting: Internal Medicine

## 2023-03-03 VITALS — BP 134/82 | HR 60 | Temp 96.6°F | Wt 159.0 lb

## 2023-03-03 DIAGNOSIS — M545 Low back pain, unspecified: Secondary | ICD-10-CM | POA: Diagnosis not present

## 2023-03-03 MED ORDER — NAPROXEN 500 MG PO TABS: 500.0000 mg | ORAL_TABLET | Freq: Two times a day (BID) | ORAL | 0 refills | Status: DC

## 2023-03-03 MED ORDER — METHOCARBAMOL 500 MG PO TABS
500.0000 mg | ORAL_TABLET | Freq: Three times a day (TID) | ORAL | 0 refills | Status: DC | PRN
Start: 2023-03-03 — End: 2023-08-12

## 2023-03-03 NOTE — Progress Notes (Signed)
Subjective:    Patient ID: Kevin Graham, male    DOB: April 24, 1971, 52 y.o.   MRN: [id number]  HPI  Patient presents to the clinic today with complaint of back pain.  This started 2 days ago after doing some heavy lifting.  He describes the pain as sore and achy. The pain is worse with movement. The pain does not radiate. He denies numbness, tingling or weakness in his lower extremities. He denies loss of bowel or bladder control. He has tried a back brace and heating pad but has not tried any medications OTC for this.  Review of Systems     Past Medical History:  Diagnosis Date   Environmental allergies    History of stomach ulcers    Seasonal allergies     Current Outpatient Medications  Medication Sig Dispense Refill   aspirin EC 81 MG tablet Take 1 tablet (81 mg total) by mouth daily. Swallow whole. 30 tablet 12   azelastine (OPTIVAR) 0.05 % ophthalmic solution Place 1 drop into both eyes daily as needed (for itchy/watery eyes). As needed 6 mL 5   cetirizine (ZYRTEC) 10 MG tablet Take 1 tablet (10 mg total) by mouth daily as needed for allergies or rhinitis. 34 tablet 1   ezetimibe (ZETIA) 10 MG tablet TAKE 1 TABLET BY MOUTH EVERY DAY 90 tablet 1   famotidine (PEPCID) 10 MG tablet Take 1 tablet (10 mg total) by mouth 2 (two) times daily. 180 tablet 1   rosuvastatin (CRESTOR) 10 MG tablet TAKE 1 TABLET BY MOUTH EVERY DAY 90 tablet 1   No current facility-administered medications for this visit.    No Known Allergies  Family History  Problem Relation Age of Onset   Hyperlipidemia Mother     Social History   Socioeconomic History   Marital status: Single    Spouse name: Not on file   Number of children: Not on file   Years of education: Not on file   Highest education level: Not on file  Occupational History   Not on file  Tobacco Use   Smoking status: Every Day    Packs/day: 0.25    Years: 38.00    Additional pack years: 0.00    Total pack years: 9.50    Types:  Cigarettes   Smokeless tobacco: Never   Tobacco comments:    2 packs per week  Vaping Use   Vaping Use: Never used  Substance and Sexual Activity   Alcohol use: Yes    Alcohol/week: 3.0 standard drinks of alcohol    Types: 3 Cans of beer per week    Comment: 1-3 beers during the week, more on the weekends   Drug use: No   Sexual activity: Yes    Partners: Female  Other Topics Concern   Not on file  Social History Narrative   Lives with girlfriend and her child in a one bedroom home.  Has 5 children.  Works at [employer].  Education: high school.   Social Determinants of Health   Financial Resource Strain: Not on file  Food Insecurity: Not on file  Transportation Needs: Not on file  Physical Activity: Not on file  Stress: Not on file  Social Connections: Not on file  Intimate Partner Violence: Not on file     Constitutional: Denies fever, malaise, fatigue, headache or abrupt weight changes.  Respiratory: Denies difficulty breathing, shortness of breath, cough or sputum production.   Cardiovascular: Denies chest pain, chest tightness, palpitations or  swelling in the hands or feet.  Gastrointestinal: Denies abdominal pain, bloating, constipation, diarrhea or blood in the stool.  GU: Denies urgency, frequency, pain with urination, burning sensation, blood in urine, odor or discharge. Musculoskeletal: Pt reports low back pain. Denies decrease in range of motion, difficulty with gait, or joint swelling.  Skin: Denies redness, rashes, lesions or ulcercations.  Neurological: Denies numbness, tingling, weakness or problems with balance and coordination.    No other specific complaints in a complete review of systems (except as listed in HPI above).  Objective:   Physical Exam  BP 134/82 (BP Location: Left Arm, Patient Position: Sitting, Cuff Size: Normal)   Pulse 60   Temp (!) 96.6 F (35.9 C) (Temporal)   Wt 159 lb (72.1 kg)   SpO2 99%   BMI 27.29 kg/m   Wt Readings from  Last 3 Encounters:  01/19/23 162 lb (73.5 kg)  08/12/22 163 lb (73.9 kg)  07/29/22 165 lb (74.8 kg)    General: Appears his stated age, overweight, in NAD. Skin: Warm, dry and intact.  HEENT: Head: normal shape and size; Eyes: sclera white, no icterus, conjunctiva pink, PERRLA and EOMs intact;  Cardiovascular: Normal rate and rhythm. S1,S2 noted.  No murmur, rubs or gallops noted.  Pulmonary/Chest: Normal effort and positive vesicular breath sounds. No respiratory distress. No wheezes, rales or ronchi noted.  Musculoskeletal: Pain with flexion and lateral bending to the left.  Normal extension, rotation and lateral bending to the right.  Bony tenderness noted over the lumbar spine and bilateral paralumbar muscles.  Able to stand on tiptoes and heels.  No difficulty with gait.  Neurological: Alert and oriented.  Coordination normal.   BMET    Component Value Date/Time   NA 140 01/19/2023 0946   K 4.2 01/19/2023 0946   CL 106 01/19/2023 0946   CO2 28 01/19/2023 0946   GLUCOSE 108 (H) 01/19/2023 0946   BUN 16 01/19/2023 0946   CREATININE 0.87 01/19/2023 0946   CALCIUM 10.4 (H) 01/19/2023 0946   GFRNONAA >60 04/06/2017 2018   GFRAA >60 04/06/2017 2018    Lipid Panel     Component Value Date/Time   CHOL 258 (H) 01/19/2023 0946   TRIG 528 (H) 01/19/2023 0946   HDL 42 01/19/2023 0946   CHOLHDL 6.1 (H) 01/19/2023 0946   VLDL 41.8 (H) 02/16/2020 0825   LDLCALC  01/19/2023 0946     Comment:     . LDL cholesterol not calculated. Triglyceride levels greater than 400 mg/dL invalidate calculated LDL results. . Reference range: <100 . Desirable range <100 mg/dL for primary prevention;   <70 mg/dL for patients with CHD or diabetic patients  with > or = 2 CHD risk factors. Marland Kitchen LDL-C is now calculated using the Martin-Hopkins  calculation, which is a validated novel method providing  better accuracy than the Friedewald equation in the  estimation of LDL-C.  Horald Pollen et al. Lenox Ahr.  1610;960(45): 2061-2068  (http://education.[facility].com/faq/FAQ164)     CBC    Component Value Date/Time   WBC 4.8 01/19/2023 0946   RBC 4.88 01/19/2023 0946   HGB 15.1 01/19/2023 0946   HCT 46.1 01/19/2023 0946   PLT 167 01/19/2023 0946   MCV 94.5 01/19/2023 0946   MCH 30.9 01/19/2023 0946   MCHC 32.8 01/19/2023 0946   RDW 12.6 01/19/2023 0946    Hgb A1C Lab Results  Component Value Date   HGBA1C 6.0 (H) 01/19/2023  Assessment & Plan:   Acute Bilateral Low Back Pain without Sciatica:  No indication for imaging at this time Rx for Naproxen 500 mg twice daily-consume with food Rx for Methocarbamol 500 mg every 8 hours as needed-sedation caution given Alternate heat and ice Back exercises given Advised to avoid heavy lifting  RTC in 4 months for follow-up of chronic conditions Nicki Reaper, NP

## 2023-03-03 NOTE — Patient Instructions (Signed)
Back Exercises The following exercises strengthen the muscles that help to support the trunk (torso) and back. They also help to keep the lower back flexible. Doing these exercises can help to prevent or lessen existing low back pain. If you have back pain or discomfort, try doing these exercises 2-3 times each day or as told by your health care provider. As your pain improves, do them once each day, but increase the number of times that you repeat the steps for each exercise (do more repetitions). To prevent the recurrence of back pain, continue to do these exercises once each day or as told by your health care provider. Do exercises exactly as told by your health care provider and adjust them as directed. It is normal to feel mild stretching, pulling, tightness, or discomfort as you do these exercises, but you should stop right away if you feel sudden pain or your pain gets worse. Exercises Single knee to chest Repeat these steps 3-5 times for each leg: Lie on your back on a firm bed or the floor with your legs extended. 

## 2023-07-22 ENCOUNTER — Ambulatory Visit: Payer: 59 | Admitting: Internal Medicine

## 2023-07-22 NOTE — Progress Notes (Deleted)
Subjective:    Patient ID: Kevin Graham, male    DOB: [birth year], 52 y.o.   MRN: [id number]  HPI  Patient presents to clinic today for 21-month follow-up of chronic conditions.  GERD: He is not sure what triggers this.  He denies breakthrough on famotidine.  Upper GI from 01/2022 reviewed.  HLD with aortic atherosclerosis: His last LDL was not calculated, triglycerides 528, 12/2022.  He denies myalgias on rosuvastatin and ezetimibe.  He is taking aspirin as well.  He does not consume a low-fat diet.  Diabetes: His last A1c was 6%.  He is not taking any oral diabetic medication at this time.  He does not check his sugars.  Review of Systems     Past Medical History:  Diagnosis Date   Environmental allergies    History of stomach ulcers    Seasonal allergies     Current Outpatient Medications  Medication Sig Dispense Refill   aspirin EC 81 MG tablet Take 1 tablet (81 mg total) by mouth daily. Swallow whole. 30 tablet 12   azelastine (OPTIVAR) 0.05 % ophthalmic solution Place 1 drop into both eyes daily as needed (for itchy/watery eyes). As needed 6 mL 5   cetirizine (ZYRTEC) 10 MG tablet Take 1 tablet (10 mg total) by mouth daily as needed for allergies or rhinitis. 34 tablet 1   ezetimibe (ZETIA) 10 MG tablet TAKE 1 TABLET BY MOUTH EVERY DAY 90 tablet 1   famotidine (PEPCID) 10 MG tablet Take 1 tablet (10 mg total) by mouth 2 (two) times daily. 180 tablet 1   methocarbamol (ROBAXIN) 500 MG tablet Take 1 tablet (500 mg total) by mouth every 8 (eight) hours as needed for muscle spasms. 20 tablet 0   naproxen (NAPROSYN) 500 MG tablet Take 1 tablet (500 mg total) by mouth 2 (two) times daily with a meal. 14 tablet 0   rosuvastatin (CRESTOR) 10 MG tablet TAKE 1 TABLET BY MOUTH EVERY DAY 90 tablet 1   No current facility-administered medications for this visit.    No Known Allergies  Family History  Problem Relation Age of Onset   Hyperlipidemia Mother     Social History    Socioeconomic History   Marital status: Single    Spouse name: Not on file   Number of children: Not on file   Years of education: Not on file   Highest education level: Not on file  Occupational History   Not on file  Tobacco Use   Smoking status: Every Day    Current packs/day: 0.25    Average packs/day: 0.3 packs/day for 38.0 years (9.5 ttl pk-yrs)    Types: Cigarettes   Smokeless tobacco: Never   Tobacco comments:    2 packs per week  Vaping Use   Vaping status: Never Used  Substance and Sexual Activity   Alcohol use: Yes    Alcohol/week: 3.0 standard drinks of alcohol    Types: 3 Cans of beer per week    Comment: 1-3 beers during the week, more on the weekends   Drug use: No   Sexual activity: Yes    Partners: Female  Other Topics Concern   Not on file  Social History Narrative   Lives with girlfriend and her child in a one bedroom home.  Has 5 children.  Works at [employer].  Education: high school.   Social Determinants of Health   Financial Resource Strain: Not on file  Food Insecurity: Not on file  Transportation  Needs: Not on file  Physical Activity: Not on file  Stress: Not on file  Social Connections: Not on file  Intimate Partner Violence: Not on file     Constitutional: Denies fever, malaise, fatigue, headache or abrupt weight changes.  HEENT: Denies eye pain, eye redness, ear pain, ringing in the ears, wax buildup, runny nose, nasal congestion, bloody nose, or sore throat. Respiratory: Denies difficulty breathing, shortness of breath, cough or sputum production.   Cardiovascular: Denies chest pain, chest tightness, palpitations or swelling in the hands or feet.  Gastrointestinal: Denies abdominal pain, bloating, constipation, diarrhea or blood in the stool.  GU: Denies urgency, frequency, pain with urination, burning sensation, blood in urine, odor or discharge. Musculoskeletal: Denies decrease in range of motion, difficulty with gait, muscle pain or  joint pain and swelling.  Skin: Denies redness, rashes, lesions or ulcercations.  Neurological: Denies dizziness, difficulty with memory, difficulty with speech or problems with balance and coordination.  Psych: Denies anxiety, depression, SI/HI.  No other specific complaints in a complete review of systems (except as listed in HPI above).  Objective:   Physical Exam There were no vitals taken for this visit. Wt Readings from Last 3 Encounters:  03/03/23 159 lb (72.1 kg)  01/19/23 162 lb (73.5 kg)  08/12/22 163 lb (73.9 kg)    General: Appears their stated age, well developed, well nourished in NAD. Skin: Warm, dry and intact. No rashes, lesions or ulcerations noted. HEENT: Head: normal shape and size; Eyes: sclera white, no icterus, conjunctiva pink, PERRLA and EOMs intact; Ears: Tm's gray and intact, normal light reflex; Nose: mucosa pink and moist, septum midline; Throat/Mouth: Teeth present, mucosa pink and moist, no exudate, lesions or ulcerations noted.  Neck:  Neck supple, trachea midline. No masses, lumps or thyromegaly present.  Cardiovascular: Normal rate and rhythm. S1,S2 noted.  No murmur, rubs or gallops noted. No JVD or BLE edema. No carotid bruits noted. Pulmonary/Chest: Normal effort and positive vesicular breath sounds. No respiratory distress. No wheezes, rales or ronchi noted.  Abdomen: Soft and nontender. Normal bowel sounds. No distention or masses noted. Liver, spleen and kidneys non palpable. Musculoskeletal: Normal range of motion. No signs of joint swelling. No difficulty with gait.  Neurological: Alert and oriented. Cranial nerves II-XII grossly intact. Coordination normal.  Psychiatric: Mood and affect normal. Behavior is normal. Judgment and thought content normal.     BMET    Component Value Date/Time   NA 140 01/19/2023 0946   K 4.2 01/19/2023 0946   CL 106 01/19/2023 0946   CO2 28 01/19/2023 0946   GLUCOSE 108 (H) 01/19/2023 0946   BUN 16 01/19/2023  0946   CREATININE 0.87 01/19/2023 0946   CALCIUM 10.4 (H) 01/19/2023 0946   GFRNONAA >60 04/06/2017 2018   GFRAA >60 04/06/2017 2018    Lipid Panel     Component Value Date/Time   CHOL 258 (H) 01/19/2023 0946   TRIG 528 (H) 01/19/2023 0946   HDL 42 01/19/2023 0946   CHOLHDL 6.1 (H) 01/19/2023 0946   VLDL 41.8 (H) 02/16/2020 0825   LDLCALC  01/19/2023 0946     Comment:     . LDL cholesterol not calculated. Triglyceride levels greater than 400 mg/dL invalidate calculated LDL results. . Reference range: <100 . Desirable range <100 mg/dL for primary prevention;   <70 mg/dL for patients with CHD or diabetic patients  with > or = 2 CHD risk factors. Marland Kitchen LDL-C is now calculated using the Martin-Hopkins  calculation,  which is a validated novel method providing  better accuracy than the Friedewald equation in the  estimation of LDL-C.  Horald Pollen et al. Lenox Ahr. 3875;643(32): 2061-2068  (http://education.[facility].com/faq/FAQ164)     CBC    Component Value Date/Time   WBC 4.8 01/19/2023 0946   RBC 4.88 01/19/2023 0946   HGB 15.1 01/19/2023 0946   HCT 46.1 01/19/2023 0946   PLT 167 01/19/2023 0946   MCV 94.5 01/19/2023 0946   MCH 30.9 01/19/2023 0946   MCHC 32.8 01/19/2023 0946   RDW 12.6 01/19/2023 0946    Hgb A1C Lab Results  Component Value Date   HGBA1C 6.0 (H) 01/19/2023             Assessment & Plan:     RTC in 6 months for your annual exam Nicki Reaper, NP

## 2023-08-03 ENCOUNTER — Ambulatory Visit: Payer: 59 | Admitting: Internal Medicine

## 2023-08-03 NOTE — Progress Notes (Deleted)
Subjective:    Patient ID: Kevin Graham, male    DOB: 07-14-1971, 52 y.o.   MRN: [id number]  HPI  Patient presents to clinic today for follow-up of chronic conditions.  GERD: He is not sure what triggers this.  He denies breakthrough on famotidine.  Upper GI from 01/2022 reviewed.  HLD with aortic atherosclerosis: His last LDL was not calculated, triglycerides 528, 12/2022.  He denies myalgias on rosuvastatin.  He is taking aspirin as well.  He does not consume a low-fat diet.  Prediabetes: His last A1c was 6%.  He is not taking any oral diabetic medication at this time.  He does not check his sugars.  Review of Systems     Past Medical History:  Diagnosis Date   Environmental allergies    History of stomach ulcers    Seasonal allergies     Current Outpatient Medications  Medication Sig Dispense Refill   aspirin EC 81 MG tablet Take 1 tablet (81 mg total) by mouth daily. Swallow whole. 30 tablet 12   azelastine (OPTIVAR) 0.05 % ophthalmic solution Place 1 drop into both eyes daily as needed (for itchy/watery eyes). As needed 6 mL 5   cetirizine (ZYRTEC) 10 MG tablet Take 1 tablet (10 mg total) by mouth daily as needed for allergies or rhinitis. 34 tablet 1   ezetimibe (ZETIA) 10 MG tablet TAKE 1 TABLET BY MOUTH EVERY DAY 90 tablet 1   famotidine (PEPCID) 10 MG tablet Take 1 tablet (10 mg total) by mouth 2 (two) times daily. 180 tablet 1   methocarbamol (ROBAXIN) 500 MG tablet Take 1 tablet (500 mg total) by mouth every 8 (eight) hours as needed for muscle spasms. 20 tablet 0   naproxen (NAPROSYN) 500 MG tablet Take 1 tablet (500 mg total) by mouth 2 (two) times daily with a meal. 14 tablet 0   rosuvastatin (CRESTOR) 10 MG tablet TAKE 1 TABLET BY MOUTH EVERY DAY 90 tablet 1   No current facility-administered medications for this visit.    No Known Allergies  Family History  Problem Relation Age of Onset   Hyperlipidemia Mother     Social History   Socioeconomic History    Marital status: Single    Spouse name: Not on file   Number of children: Not on file   Years of education: Not on file   Highest education level: Not on file  Occupational History   Not on file  Tobacco Use   Smoking status: Every Day    Current packs/day: 0.25    Average packs/day: 0.3 packs/day for 38.0 years (9.5 ttl pk-yrs)    Types: Cigarettes   Smokeless tobacco: Never   Tobacco comments:    2 packs per week  Vaping Use   Vaping status: Never Used  Substance and Sexual Activity   Alcohol use: Yes    Alcohol/week: 3.0 standard drinks of alcohol    Types: 3 Cans of beer per week    Comment: 1-3 beers during the week, more on the weekends   Drug use: No   Sexual activity: Yes    Partners: Female  Other Topics Concern   Not on file  Social History Narrative   Lives with girlfriend and her child in a one bedroom home.  Has 5 children.  Works at [employer].  Education: high school.   Social Determinants of Health   Financial Resource Strain: Not on file  Food Insecurity: Not on file  Transportation Needs: Not on  file  Physical Activity: Not on file  Stress: Not on file  Social Connections: Not on file  Intimate Partner Violence: Not on file     Constitutional: Denies fever, malaise, fatigue, headache or abrupt weight changes.  HEENT: Denies eye pain, eye redness, ear pain, ringing in the ears, wax buildup, runny nose, nasal congestion, bloody nose, or sore throat. Respiratory: Denies difficulty breathing, shortness of breath, cough or sputum production.   Cardiovascular: Denies chest pain, chest tightness, palpitations or swelling in the hands or feet.  Gastrointestinal: Denies abdominal pain, bloating, constipation, diarrhea or blood in the stool.  GU: Denies urgency, frequency, pain with urination, burning sensation, blood in urine, odor or discharge. Musculoskeletal: Denies decrease in range of motion, difficulty with gait, muscle pain or joint pain and swelling.   Skin: Denies redness, rashes, lesions or ulcercations.  Neurological: Denies dizziness, difficulty with memory, difficulty with speech or problems with balance and coordination.  Psych: Denies anxiety, depression, SI/HI.  No other specific complaints in a complete review of systems (except as listed in HPI above).  Objective:   Physical Exam  There were no vitals taken for this visit. Wt Readings from Last 3 Encounters:  03/03/23 159 lb (72.1 kg)  01/19/23 162 lb (73.5 kg)  08/12/22 163 lb (73.9 kg)    General: Appears their stated age, well developed, well nourished in NAD. Skin: Warm, dry and intact. No rashes, lesions or ulcerations noted. HEENT: Head: normal shape and size; Eyes: sclera white, no icterus, conjunctiva pink, PERRLA and EOMs intact; Ears: Tm's gray and intact, normal light reflex; Nose: mucosa pink and moist, septum midline; Throat/Mouth: Teeth present, mucosa pink and moist, no exudate, lesions or ulcerations noted.  Neck:  Neck supple, trachea midline. No masses, lumps or thyromegaly present.  Cardiovascular: Normal rate and rhythm. S1,S2 noted.  No murmur, rubs or gallops noted. No JVD or BLE edema. No carotid bruits noted. Pulmonary/Chest: Normal effort and positive vesicular breath sounds. No respiratory distress. No wheezes, rales or ronchi noted.  Abdomen: Soft and nontender. Normal bowel sounds. No distention or masses noted. Liver, spleen and kidneys non palpable. Musculoskeletal: Normal range of motion. No signs of joint swelling. No difficulty with gait.  Neurological: Alert and oriented. Cranial nerves II-XII grossly intact. Coordination normal.  Psychiatric: Mood and affect normal. Behavior is normal. Judgment and thought content normal.     BMET    Component Value Date/Time   NA 140 01/19/2023 0946   K 4.2 01/19/2023 0946   CL 106 01/19/2023 0946   CO2 28 01/19/2023 0946   GLUCOSE 108 (H) 01/19/2023 0946   BUN 16 01/19/2023 0946   CREATININE  0.87 01/19/2023 0946   CALCIUM 10.4 (H) 01/19/2023 0946   GFRNONAA >60 04/06/2017 2018   GFRAA >60 04/06/2017 2018    Lipid Panel     Component Value Date/Time   CHOL 258 (H) 01/19/2023 0946   TRIG 528 (H) 01/19/2023 0946   HDL 42 01/19/2023 0946   CHOLHDL 6.1 (H) 01/19/2023 0946   VLDL 41.8 (H) 02/16/2020 0825   LDLCALC  01/19/2023 0946     Comment:     . LDL cholesterol not calculated. Triglyceride levels greater than 400 mg/dL invalidate calculated LDL results. . Reference range: <100 . Desirable range <100 mg/dL for primary prevention;   <70 mg/dL for patients with CHD or diabetic patients  with > or = 2 CHD risk factors. Marland Kitchen LDL-C is now calculated using the Martin-Hopkins  calculation, which is  a validated novel method providing  better accuracy than the Friedewald equation in the  estimation of LDL-C.  Horald Pollen et al. Lenox Ahr. 9518;841(66): 2061-2068  (http://education.[facility].com/faq/FAQ164)     CBC    Component Value Date/Time   WBC 4.8 01/19/2023 0946   RBC 4.88 01/19/2023 0946   HGB 15.1 01/19/2023 0946   HCT 46.1 01/19/2023 0946   PLT 167 01/19/2023 0946   MCV 94.5 01/19/2023 0946   MCH 30.9 01/19/2023 0946   MCHC 32.8 01/19/2023 0946   RDW 12.6 01/19/2023 0946    Hgb A1C Lab Results  Component Value Date   HGBA1C 6.0 (H) 01/19/2023            Assessment & Plan:    RTC in 6 months for your annual exam Nicki Reaper, NP

## 2023-08-11 ENCOUNTER — Ambulatory Visit: Payer: 59 | Admitting: Internal Medicine

## 2023-08-12 ENCOUNTER — Encounter: Payer: Self-pay | Admitting: Internal Medicine

## 2023-08-12 ENCOUNTER — Ambulatory Visit: Payer: 59 | Admitting: Internal Medicine

## 2023-08-12 VITALS — BP 118/74 | HR 63 | Ht 64.0 in | Wt 162.2 lb

## 2023-08-12 DIAGNOSIS — E782 Mixed hyperlipidemia: Secondary | ICD-10-CM | POA: Diagnosis not present

## 2023-08-12 DIAGNOSIS — R7303 Prediabetes: Secondary | ICD-10-CM | POA: Diagnosis not present

## 2023-08-12 DIAGNOSIS — Z6827 Body mass index (BMI) 27.0-27.9, adult: Secondary | ICD-10-CM

## 2023-08-12 DIAGNOSIS — Z23 Encounter for immunization: Secondary | ICD-10-CM | POA: Diagnosis not present

## 2023-08-12 DIAGNOSIS — E663 Overweight: Secondary | ICD-10-CM

## 2023-08-12 DIAGNOSIS — K219 Gastro-esophageal reflux disease without esophagitis: Secondary | ICD-10-CM | POA: Diagnosis not present

## 2023-08-12 DIAGNOSIS — I700 Atherosclerosis of aorta: Secondary | ICD-10-CM

## 2023-08-12 DIAGNOSIS — F1090 Alcohol use, unspecified, uncomplicated: Secondary | ICD-10-CM | POA: Insufficient documentation

## 2023-08-12 MED ORDER — ASPIRIN 81 MG PO TBEC: 81.0000 mg | DELAYED_RELEASE_TABLET | Freq: Every day | ORAL | Status: AC

## 2023-08-12 NOTE — Assessment & Plan Note (Signed)
C-Met and lipid profile today Encouraged him to consume a low-fat diet Continue rosuvastatin and ezetimibe Consider referral to lipid clinic for further evaluation and treatment

## 2023-08-12 NOTE — Patient Instructions (Signed)

## 2023-08-12 NOTE — Assessment & Plan Note (Signed)
Encouraged diet and exercise for weight loss ?

## 2023-08-12 NOTE — Assessment & Plan Note (Signed)
He does not feel like he has an alcohol problem at this time We will continue to monitor

## 2023-08-12 NOTE — Progress Notes (Signed)
Subjective:    Patient ID: Jazari Nobel, male    DOB: [birth year], 52 y.o.   MRN: [id number]  HPI  Patient presents to clinic today for 74-month follow-up of chronic conditions.  HLD with aortic atherosclerosis: His last LDL was not calculated, triglycerides 558, 01/2023.  He denies myalgias on rosuvastatin and ezetimibe but he reports he has been out of his medication for 2 weeks.  He is taking aspirin as well.  He does not consume a low-fat diet.  GERD: He is not sure what triggers this.  He denies breakthrough on famotidine.  Upper GI from 01/2022 reviewed.  Prediabetes: His last A1c was 6%, 01/2023.  He is not taking any oral diabetic medication at this time.  He does not check his sugars.  Review of Systems     Past Medical History:  Diagnosis Date   Environmental allergies    History of stomach ulcers    Seasonal allergies     Current Outpatient Medications  Medication Sig Dispense Refill   azelastine (OPTIVAR) 0.05 % ophthalmic solution Place 1 drop into both eyes daily as needed (for itchy/watery eyes). As needed 6 mL 5   ezetimibe (ZETIA) 10 MG tablet TAKE 1 TABLET BY MOUTH EVERY DAY 90 tablet 1   famotidine (PEPCID) 10 MG tablet Take 1 tablet (10 mg total) by mouth 2 (two) times daily. 180 tablet 1   naproxen (NAPROSYN) 500 MG tablet Take 1 tablet (500 mg total) by mouth 2 (two) times daily with a meal. 14 tablet 0   rosuvastatin (CRESTOR) 10 MG tablet TAKE 1 TABLET BY MOUTH EVERY DAY 90 tablet 1   cetirizine (ZYRTEC) 10 MG tablet Take 1 tablet (10 mg total) by mouth daily as needed for allergies or rhinitis. (Patient not taking: Reported on 08/12/2023) 34 tablet 1   No current facility-administered medications for this visit.    No Known Allergies  Family History  Problem Relation Age of Onset   Hyperlipidemia Mother     Social History   Socioeconomic History   Marital status: Single    Spouse name: Not on file   Number of children: Not on file   Years of  education: Not on file   Highest education level: Not on file  Occupational History   Not on file  Tobacco Use   Smoking status: Every Day    Current packs/day: 0.50    Average packs/day: 0.5 packs/day for 38.0 years (19.0 ttl pk-yrs)    Types: Cigarettes   Smokeless tobacco: Never   Tobacco comments:            Vaping Use   Vaping status: Never Used  Substance and Sexual Activity   Alcohol use: Yes    Alcohol/week: 3.0 standard drinks of alcohol    Types: 3 Cans of beer per week    Comment: 1-3 beers during the week, more on the weekends   Drug use: No   Sexual activity: Yes    Partners: Female  Other Topics Concern   Not on file  Social History Narrative   Lives with girlfriend and her child in a one bedroom home.  Has 5 children.  Works at [employer].  Education: high school.   Social Determinants of Health   Financial Resource Strain: Not on file  Food Insecurity: Not on file  Transportation Needs: Not on file  Physical Activity: Not on file  Stress: Not on file  Social Connections: Not on file  Intimate Partner Violence:  Not on file     Constitutional: Denies fever, malaise, fatigue, headache or abrupt weight changes.  HEENT: Denies eye pain, eye redness, ear pain, ringing in the ears, wax buildup, runny nose, nasal congestion, bloody nose, or sore throat. Respiratory: Denies difficulty breathing, shortness of breath, cough or sputum production.   Cardiovascular: Denies chest pain, chest tightness, palpitations or swelling in the hands or feet.  Gastrointestinal: Denies abdominal pain, bloating, constipation, diarrhea or blood in the stool.  GU: Denies urgency, frequency, pain with urination, burning sensation, blood in urine, odor or discharge. Musculoskeletal: Denies decrease in range of motion, difficulty with gait, muscle pain or joint pain and swelling.  Skin: Denies redness, rashes, lesions or ulcercations.  Neurological: Denies dizziness, difficulty with  memory, difficulty with speech or problems with balance and coordination.  Psych: Denies anxiety, depression, SI/HI.  No other specific complaints in a complete review of systems (except as listed in HPI above).  Objective:   Physical Exam   BP 118/74   Pulse 63   Ht 5\' 4"  (1.626 m)   Wt 162 lb 3.2 oz (73.6 kg)   SpO2 99%   BMI 27.84 kg/m  Wt Readings from Last 3 Encounters:  08/12/23 162 lb 3.2 oz (73.6 kg)  03/03/23 159 lb (72.1 kg)  01/19/23 162 lb (73.5 kg)    General: Appears his stated age, overweight, in NAD. Skin: Warm, dry and intact.  HEENT: Head: normal shape and size; Eyes: sclera white, no icterus, conjunctiva pink, PERRLA and EOMs intact;  Cardiovascular: Normal rate and rhythm. S1,S2 noted.  No murmur, rubs or gallops noted. No JVD or BLE edema. No carotid bruits noted. Pulmonary/Chest: Normal effort and positive vesicular breath sounds. No respiratory distress. No wheezes, rales or ronchi noted.  Abdomen: Soft and nontender. Normal bowel sounds. No distention or masses noted. Liver, spleen and kidneys non palpable. Musculoskeletal: No difficulty with gait.  Neurological: Alert and oriented.  Coordination normal.   BMET    Component Value Date/Time   NA 140 01/19/2023 0946   K 4.2 01/19/2023 0946   CL 106 01/19/2023 0946   CO2 28 01/19/2023 0946   GLUCOSE 108 (H) 01/19/2023 0946   BUN 16 01/19/2023 0946   CREATININE 0.87 01/19/2023 0946   CALCIUM 10.4 (H) 01/19/2023 0946   GFRNONAA >60 04/06/2017 2018   GFRAA >60 04/06/2017 2018    Lipid Panel     Component Value Date/Time   CHOL 258 (H) 01/19/2023 0946   TRIG 528 (H) 01/19/2023 0946   HDL 42 01/19/2023 0946   CHOLHDL 6.1 (H) 01/19/2023 0946   VLDL 41.8 (H) 02/16/2020 0825   LDLCALC  01/19/2023 0946     Comment:     . LDL cholesterol not calculated. Triglyceride levels greater than 400 mg/dL invalidate calculated LDL results. . Reference range: <100 . Desirable range <100 mg/dL for primary  prevention;   <70 mg/dL for patients with CHD or diabetic patients  with > or = 2 CHD risk factors. Marland Kitchen LDL-C is now calculated using the Martin-Hopkins  calculation, which is a validated novel method providing  better accuracy than the Friedewald equation in the  estimation of LDL-C.  Horald Pollen et al. Lenox Ahr. 0865;784(69): 2061-2068  (http://education.[facility].com/faq/FAQ164)     CBC    Component Value Date/Time   WBC 4.8 01/19/2023 0946   RBC 4.88 01/19/2023 0946   HGB 15.1 01/19/2023 0946   HCT 46.1 01/19/2023 0946   PLT 167 01/19/2023 0946   MCV 94.5  01/19/2023 0946   MCH 30.9 01/19/2023 0946   MCHC 32.8 01/19/2023 0946   RDW 12.6 01/19/2023 0946    Hgb A1C Lab Results  Component Value Date   HGBA1C 6.0 (H) 01/19/2023           Assessment & Plan:     RTC in 6 months for your annual exam Nicki Reaper, NP

## 2023-08-12 NOTE — Assessment & Plan Note (Signed)
Try to identify and avoid foods that trigger reflux Encourage weight loss as this can help reduce reflux symptoms Continue famotidine

## 2023-08-13 ENCOUNTER — Other Ambulatory Visit: Payer: Self-pay | Admitting: Internal Medicine

## 2023-08-13 DIAGNOSIS — E782 Mixed hyperlipidemia: Secondary | ICD-10-CM

## 2023-08-13 LAB — COMPLETE METABOLIC PANEL WITH GFR
AG Ratio: 1.9 (calc) (ref 1.0–2.5)
ALT: 24 U/L (ref 9–46)
AST: 16 U/L (ref 10–35)
Albumin: 4.6 g/dL (ref 3.6–5.1)
Alkaline phosphatase (APISO): 57 U/L (ref 35–144)
BUN: 17 mg/dL (ref 7–25)
CO2: 26 mmol/L (ref 20–32)
Calcium: 10.2 mg/dL (ref 8.6–10.3)
Chloride: 107 mmol/L (ref 98–110)
Creat: 0.83 mg/dL (ref 0.70–1.30)
Globulin: 2.4 g/dL (ref 1.9–3.7)
Glucose, Bld: 109 mg/dL — ABNORMAL HIGH (ref 65–99)
Potassium: 4.2 mmol/L (ref 3.5–5.3)
Sodium: 139 mmol/L (ref 135–146)
Total Bilirubin: 0.4 mg/dL (ref 0.2–1.2)
Total Protein: 7 g/dL (ref 6.1–8.1)
eGFR: 105 mL/min/{1.73_m2} (ref >=60)

## 2023-08-13 LAB — CBC
HCT: 47.6 % (ref 38.5–50.0)
Hemoglobin: 16 g/dL (ref 13.2–17.1)
MCH: 32.3 pg (ref 27.0–33.0)
MCHC: 33.6 g/dL (ref 32.0–36.0)
MCV: 96 fL (ref 80.0–100.0)
MPV: 10.2 fL (ref 7.5–12.5)
Platelets: 165 10*3/uL (ref 140–400)
RBC: 4.96 10*6/uL (ref 4.20–5.80)
RDW: 12.3 % (ref 11.0–15.0)
WBC: 5 10*3/uL (ref 3.8–10.8)

## 2023-08-13 LAB — LIPID PANEL
Cholesterol: 253 mg/dL — ABNORMAL HIGH (ref <200)
HDL: 43 mg/dL (ref >=40)
Non-HDL Cholesterol (Calc): 210 mg/dL — ABNORMAL HIGH (ref <130)
Total CHOL/HDL Ratio: 5.9 (calc) — ABNORMAL HIGH (ref <5.0)
Triglycerides: 401 mg/dL — ABNORMAL HIGH (ref <150)

## 2023-08-13 LAB — HEMOGLOBIN A1C
Hgb A1c MFr Bld: 5.9 %{Hb} — ABNORMAL HIGH (ref <5.7)
Mean Plasma Glucose: 123 mg/dL
eAG (mmol/L): 6.8 mmol/L

## 2023-08-14 NOTE — Telephone Encounter (Signed)
Requested Prescriptions  Pending Prescriptions Disp Refills   rosuvastatin (CRESTOR) 10 MG tablet [Pharmacy Med Name: ROSUVASTATIN CALCIUM 10 MG TAB] 30 tablet 5    Sig: TAKE 1 TABLET BY MOUTH EVERY DAY     Cardiovascular:  Antilipid - Statins 2 Failed - 08/13/2023  4:18 PM      Failed - Lipid Panel in normal range within the last 12 months    Cholesterol  Date Value Ref Range Status  08/12/2023 253 (H) <200 mg/dL Final   LDL Cholesterol (Calc)  Date Value Ref Range Status  08/12/2023  mg/dL (calc) Final    Comment:    . LDL cholesterol not calculated. Triglyceride levels greater than 400 mg/dL invalidate calculated LDL results. . Reference range: <100 . Desirable range <100 mg/dL for primary prevention;   <70 mg/dL for patients with CHD or diabetic patients  with > or = 2 CHD risk factors. Marland Kitchen LDL-C is now calculated using the Martin-Hopkins  calculation, which is a validated novel method providing  better accuracy than the Friedewald equation in the  estimation of LDL-C.  Horald Pollen et al. Lenox Ahr. 5409;811(91): 2061-2068  (http://education.[facility].com/faq/FAQ164)    Direct LDL  Date Value Ref Range Status  02/16/2020 118.0 mg/dL Final    Comment:    Optimal:  <100 mg/dLNear or Above Optimal:  100-129 mg/dLBorderline High:  130-159 mg/dLHigh:  160-189 mg/dLVery High:  >190 mg/dL   HDL  Date Value Ref Range Status  08/12/2023 43 > OR = 40 mg/dL Final   Triglycerides  Date Value Ref Range Status  08/12/2023 401 (H) <150 mg/dL Final    Comment:    . If a non-fasting specimen was collected, consider repeat triglyceride testing on a fasting specimen if clinically indicated.  Perry Mount et al. J. of Clin. Lipidol. 2015;9:129-169. Marland Kitchen          Passed - Cr in normal range and within 360 days    Creat  Date Value Ref Range Status  08/12/2023 0.83 0.70 - 1.30 mg/dL Final         Passed - Patient is not pregnant      Passed - Valid encounter within last 12  months    Recent Outpatient Visits           2 days ago Aortic atherosclerosis [facility])   [place] [facility] [place], [place] W, NP   5 months ago Acute bilateral low back pain without sciatica   [place] [facility] [place], Salvadore Oxford, NP   6 months ago Encounter for general adult medical examination with abnormal findings   [place] [facility] [place], Salvadore Oxford, NP   1 year ago Aortic atherosclerosis [facility])   [place] [facility] [place], Salvadore Oxford, NP   1 year ago Acute nasopharyngitis   [place] [facility] [place], Salvadore Oxford, NP       Future Appointments             In 5 months Baity, Salvadore Oxford, NP [place] [facility]             ezetimibe (ZETIA) 10 MG tablet [Pharmacy Med Name: EZETIMIBE 10 MG TABLET] 30 tablet 5    Sig: TAKE 1 TABLET BY MOUTH EVERY DAY     Cardiovascular:  Antilipid - Sterol Transport Inhibitors Failed - 08/13/2023  4:18 PM      Failed - Lipid Panel in normal range within  the last 12 months    Cholesterol  Date Value Ref Range Status  08/12/2023 253 (H) <200 mg/dL Final   LDL Cholesterol (Calc)  Date Value Ref Range Status  08/12/2023  mg/dL (calc) Final    Comment:    . LDL cholesterol not calculated. Triglyceride levels greater than 400 mg/dL invalidate calculated LDL results. . Reference range: <100 . Desirable range <100 mg/dL for primary prevention;   <70 mg/dL for patients with CHD or diabetic patients  with > or = 2 CHD risk factors. Marland Kitchen LDL-C is now calculated using the Martin-Hopkins  calculation, which is a validated novel method providing  better accuracy than the Friedewald equation in the  estimation of LDL-C.  Horald Pollen et al. Lenox Ahr. 8295;621(30): 2061-2068  (http://education.[facility].com/faq/FAQ164)    Direct LDL  Date Value Ref Range Status  02/16/2020 118.0 mg/dL Final    Comment:    Optimal:  <100  mg/dLNear or Above Optimal:  100-129 mg/dLBorderline High:  130-159 mg/dLHigh:  160-189 mg/dLVery High:  >190 mg/dL   HDL  Date Value Ref Range Status  08/12/2023 43 > OR = 40 mg/dL Final   Triglycerides  Date Value Ref Range Status  08/12/2023 401 (H) <150 mg/dL Final    Comment:    . If a non-fasting specimen was collected, consider repeat triglyceride testing on a fasting specimen if clinically indicated.  Perry Mount et al. J. of Clin. Lipidol. 2015;9:129-169. Marland Kitchen          Passed - AST in normal range and within 360 days    AST  Date Value Ref Range Status  08/12/2023 16 10 - 35 U/L Final         Passed - ALT in normal range and within 360 days    ALT  Date Value Ref Range Status  08/12/2023 24 9 - 46 U/L Final         Passed - Patient is not pregnant      Passed - Valid encounter within last 12 months    Recent Outpatient Visits           2 days ago Aortic atherosclerosis [facility])   [place] [facility] [place], [place] W, NP   5 months ago Acute bilateral low back pain without sciatica   [place] [facility], Salvadore Oxford, NP   6 months ago Encounter for general adult medical examination with abnormal findings   [place] [facility] [place], Salvadore Oxford, NP   1 year ago Aortic atherosclerosis [facility])   [place] [facility] [place], Salvadore Oxford, NP   1 year ago Acute nasopharyngitis   [place] [facility] [place], Salvadore Oxford, NP       Future Appointments             In 5 months Baity, Salvadore Oxford, NP [place] [facility]

## 2023-10-21 ENCOUNTER — Ambulatory Visit (INDEPENDENT_AMBULATORY_CARE_PROVIDER_SITE_OTHER): Payer: 59 | Admitting: Internal Medicine

## 2023-10-21 ENCOUNTER — Encounter: Payer: Self-pay | Admitting: Internal Medicine

## 2023-10-21 VITALS — BP 138/78 | HR 59 | Temp 97.9°F | Ht 64.0 in | Wt 161.4 lb

## 2023-10-21 DIAGNOSIS — J00 Acute nasopharyngitis [common cold]: Secondary | ICD-10-CM | POA: Diagnosis not present

## 2023-10-21 MED ORDER — BENZONATATE 100 MG PO CAPS
100.0000 mg | ORAL_CAPSULE | Freq: Three times a day (TID) | ORAL | 0 refills | Status: DC | PRN
Start: 2023-10-21 — End: 2023-12-21

## 2023-10-21 MED ORDER — AZITHROMYCIN 250 MG PO TABS: ORAL_TABLET | ORAL | 0 refills | Status: DC

## 2023-10-21 NOTE — Progress Notes (Signed)
Subjective:    Patient ID: Kevin Graham, male    DOB: 04-23-1971, 53 y.o.   MRN: [id number]  HPI  Discussed the use of AI scribe software for clinical note transcription with the patient, who gave verbal consent to proceed.  The patient, with a week-long history of upper respiratory symptoms, presents with a chief complaint of sore throat and chest congestion. He describes the sore throat as mild and denies any associated difficulty swallowing. The chest congestion is accompanied by a productive cough, but he denies any shortness of breath. He also reports mild ear discomfort and intermittent nasal congestion with occasional rhinorrhea.  He denies shortness of breath, nausea, vomiting, diarrhea, fever, chills or bodyaches.  The patient has been self-managing symptoms with over-the-counter cough, cold, and flu medication. He denies any recent exposure to sick contacts.       Review of Systems   Past Medical History:  Diagnosis Date   Environmental allergies    History of stomach ulcers    Seasonal allergies     Current Outpatient Medications  Medication Sig Dispense Refill   aspirin EC 81 MG tablet Take 1 tablet (81 mg total) by mouth daily. Swallow whole.     azelastine (OPTIVAR) 0.05 % ophthalmic solution Place 1 drop into both eyes daily as needed (for itchy/watery eyes). As needed 6 mL 5   cetirizine (ZYRTEC) 10 MG tablet Take 1 tablet (10 mg total) by mouth daily as needed for allergies or rhinitis. (Patient not taking: Reported on 08/12/2023) 34 tablet 1   ezetimibe (ZETIA) 10 MG tablet TAKE 1 TABLET BY MOUTH EVERY DAY 30 tablet 5   famotidine (PEPCID) 10 MG tablet Take 1 tablet (10 mg total) by mouth 2 (two) times daily. 180 tablet 1   naproxen (NAPROSYN) 500 MG tablet Take 1 tablet (500 mg total) by mouth 2 (two) times daily with a meal. 14 tablet 0   rosuvastatin (CRESTOR) 10 MG tablet TAKE 1 TABLET BY MOUTH EVERY DAY 30 tablet 5   No current facility-administered  medications for this visit.    No Known Allergies  Family History  Problem Relation Age of Onset   Hyperlipidemia Mother     Social History   Socioeconomic History   Marital status: Single    Spouse name: Not on file   Number of children: Not on file   Years of education: Not on file   Highest education level: Not on file  Occupational History   Not on file  Tobacco Use   Smoking status: Every Day    Current packs/day: 0.50    Average packs/day: 0.5 packs/day for 38.0 years (19.0 ttl pk-yrs)    Types: Cigarettes   Smokeless tobacco: Never   Tobacco comments:            Vaping Use   Vaping status: Never Used  Substance and Sexual Activity   Alcohol use: Yes    Alcohol/week: 3.0 standard drinks of alcohol    Types: 3 Cans of beer per week    Comment: 1-3 beers during the week, more on the weekends   Drug use: No   Sexual activity: Yes    Partners: Female  Other Topics Concern   Not on file  Social History Narrative   Lives with girlfriend and her child in a one bedroom home.  Has 5 children.  Works at [employer].  Education: high school.   Social Drivers of Corporate investment banker Strain: Not on file  Food Insecurity: Not on file  Transportation Needs: Not on file  Physical Activity: Not on file  Stress: Not on file  Social Connections: Not on file  Intimate Partner Violence: Not on file     Constitutional: Denies fever, malaise, fatigue, headache or abrupt weight changes.  HEENT: Pt reports ear fullness, nasal congestion, runny nose, and sore throat. Denies eye pain, eye redness, ringing in the ears, wax buildup, bloody nose. Respiratory: Pt reports cough. Denies difficulty breathing, shortness of breath.   Cardiovascular: Denies chest pain, chest tightness, palpitations or swelling in the hands or feet.  Gastrointestinal: Denies abdominal pain, bloating, constipation, diarrhea or blood in the stool.  Musculoskeletal: Denies decrease in range of motion,  difficulty with gait, muscle pain or joint pain and swelling.  Skin: Denies redness, rashes, lesions or ulcercations.  Neurological: Denies dizziness, difficulty with memory, difficulty with speech or problems with balance and coordination.    No other specific complaints in a complete review of systems (except as listed in HPI above).      Objective:   Physical Exam  BP (!) 142/84 (BP Location: Left Arm, Patient Position: Sitting, Cuff Size: Normal)   Pulse (!) 59   Temp 97.9 F (36.6 C)   Ht 5\' 4"  (1.626 m)   Wt 161 lb 6.4 oz (73.2 kg)   SpO2 98%   BMI 27.70 kg/m   Wt Readings from Last 3 Encounters:  08/12/23 162 lb 3.2 oz (73.6 kg)  03/03/23 159 lb (72.1 kg)  01/19/23 162 lb (73.5 kg)    General: Appears his stated age, overweight, in NAD. Skin: Warm, dry and intact. No rashes noted. HEENT: Head: normal shape and size, no sinus tenderness noted; Eyes: sclera white, no icterus, conjunctiva pink, PERRLA and EOMs intact; Ears: Tm's gray and intact, normal light reflex; Nose: mucosa pink and moist, septum midline; Throat/Mouth: Teeth present, mucosa erythematous and moist, +PND,  no exudate, lesions or ulcerations noted.  Neck: No adenopathy noted. Cardiovascular: Bradycardia with normal rhythm. Pulmonary/Chest: Normal effort and positive vesicular breath sounds. No respiratory distress. No wheezes, rales or ronchi noted.  Neurological: Alert and oriented.   BMET    Component Value Date/Time   NA 139 08/12/2023 0918   K 4.2 08/12/2023 0918   CL 107 08/12/2023 0918   CO2 26 08/12/2023 0918   GLUCOSE 109 (H) 08/12/2023 0918   BUN 17 08/12/2023 0918   CREATININE 0.83 08/12/2023 0918   CALCIUM 10.2 08/12/2023 0918   GFRNONAA >60 04/06/2017 2018   GFRAA >60 04/06/2017 2018    Lipid Panel     Component Value Date/Time   CHOL 253 (H) 08/12/2023 0918   TRIG 401 (H) 08/12/2023 0918   HDL 43 08/12/2023 0918   CHOLHDL 5.9 (H) 08/12/2023 0918   VLDL 41.8 (H) 02/16/2020  0825   LDLCALC  08/12/2023 0918     Comment:     . LDL cholesterol not calculated. Triglyceride levels greater than 400 mg/dL invalidate calculated LDL results. . Reference range: <100 . Desirable range <100 mg/dL for primary prevention;   <70 mg/dL for patients with CHD or diabetic patients  with > or = 2 CHD risk factors. Marland Kitchen LDL-C is now calculated using the Martin-Hopkins  calculation, which is a validated novel method providing  better accuracy than the Friedewald equation in the  estimation of LDL-C.  Horald Pollen et al. Lenox Ahr. 2440;102(72): 2061-2068  (http://education.[facility].com/faq/FAQ164)     CBC    Component Value Date/Time   WBC  5.0 08/12/2023 0918   RBC 4.96 08/12/2023 0918   HGB 16.0 08/12/2023 0918   HCT 47.6 08/12/2023 0918   PLT 165 08/12/2023 0918   MCV 96.0 08/12/2023 0918   MCH 32.3 08/12/2023 0918   MCHC 33.6 08/12/2023 0918   RDW 12.3 08/12/2023 0918    Hgb A1C Lab Results  Component Value Date   HGBA1C 5.9 (H) 08/12/2023            Assessment & Plan:    Assessment and Plan    Upper Respiratory Infection Symptoms of sore throat, cough, and nasal congestion for one week. Physical exam reveals erythematous throat, but clear lungs. No fever, vomiting, diarrhea, or shortness of breath reported. -Prescribe Azithromycin (Z-Pak) for 5 days. -Prescribe Tessalon 100mg  TID for cough. -Advise patient to follow up if symptoms do not improve after completing the course of antibiotics.       RTC in 3 months for your annual exam Nicki Reaper, NP

## 2023-10-21 NOTE — Patient Instructions (Signed)

## 2023-12-21 ENCOUNTER — Ambulatory Visit (INDEPENDENT_AMBULATORY_CARE_PROVIDER_SITE_OTHER): Payer: Self-pay | Admitting: Internal Medicine

## 2023-12-21 ENCOUNTER — Encounter: Payer: Self-pay | Admitting: Internal Medicine

## 2023-12-21 VITALS — BP 132/84 | Ht 64.0 in | Wt 158.2 lb

## 2023-12-21 DIAGNOSIS — E663 Overweight: Secondary | ICD-10-CM | POA: Diagnosis not present

## 2023-12-21 DIAGNOSIS — I10 Essential (primary) hypertension: Secondary | ICD-10-CM | POA: Insufficient documentation

## 2023-12-21 DIAGNOSIS — Z6827 Body mass index (BMI) 27.0-27.9, adult: Secondary | ICD-10-CM

## 2023-12-21 MED ORDER — OLMESARTAN MEDOXOMIL 20 MG PO TABS: 20.0000 mg | ORAL_TABLET | Freq: Every day | ORAL | 1 refills | Status: DC

## 2023-12-21 NOTE — Patient Instructions (Signed)
T?ng huy?t p, Ng??i l?n Hypertension, Adult Huy?t p cao (t?ng huy?t p) l khi l?c b?m mu qua ??ng m?ch qu m?nh. ??ng m?ch l cc m?ch mu mang mu t? tim ?i kh?p c? th?. T?ng huy?t p khi?n tim lm vi?c v?t v? h?n ?? b?m mu v c th? khi?n cc ??ng m?ch tr? ln h?p ho?c c?ng. T?ng huy?t p khng ???c ?i?u tr? ho?c ki?m sot c th? d?n t?i nh?i mu c? tim, suy tim, ??t qu?, b?nh th?n v nh?ng v?n ?? khc. Ch? s? ?o huy?t p g?m m?t ch? s? cao trn m?t ch? s? th?p. L t??ng l huy?t a?p cu?a qu v? c?n ph?i d??i 120/80. Ch? s? ??u tin ("??nh") ???c g?i l huy?t p tm thu. ?y l s? ?o p su?t trong ??ng m?ch khi tim qu v? ??p. Ch? s? th? hai ("?y") ???c g?i l huy?t p tm tr??ng. ?y l s? ?o p su?t trong ??ng m?ch khi tim qu v? ngh?[place] nhn g gy ra? Khng r nguyn nhn chnh xc gy ra tnh tr?ng ny. C m?t s? tnh tr?ng d?n ??n huy?t p cao. ?i?u g lm t?ng nguy c?? M?t s? y?u t? nh?t ??nh c th? khi?n cho qu v? d? b? cao huy?t p h?n. M?t s? trong nh?ng y?u t? nguy c? ny n?m trong t?m ki?m sot c?a qu v?, bao g?m: Ht thu?c. Khng t?p th? d?c ho?c cc ho?t ??ng th? ch?t ??y ??Marland Kitchen Th?a cn. ?n qu nhi?u ch?t bo, ???ng, ca-lo, ho?c mu?i (Natri). U?ng qu nhi?u r??u. Cc y?u t? nguy c? khc bao g?m: C ti?n s? c nhn b? b?nh tim, ti?u ???ng, cholesterol cao ho?c b?nh th?n. C?ng th?ng. C ti?n s? gia ?nh b? cao huy?t p v cholesterol cao. Ng?ng th? khi ng? do t?c ngh?n. ?? tu?i. Nguy c? t?ng ln theo ?? tu?i. C cc d?u hi?u ho?c tri?u ch?ng g? Huy?t p cao c th? khng gy ra cc tri?u ch?ng. Huy?t p r?t cao (c?n t?ng huy?t p) c th? gy ra: ?au ??u. Nh?p tim nhanh ho?c khng ??u (?nh tr?ng ng?c). Kh th?. Ch?y mu cam. Bu?n nn v nn. Th? l?c thay ??i. ?au ng?c d? d?i, chng m?t v co gi?t. Ch?n ?on tnh tr?ng ny nh? th? no? Tnh tr?ng ny ???c ch?n ?on b?ng cch ?o huy?t p c?a qu v? lc qu v? ng?i, ?? tay trn m?t b? m?t ph?ng, hai chn khng b?t  cho v hai bn chn b?ng ph?ng trn sn. B?ng qu?n thi?t b? ?o huy?t p s? ???c qu?n tr?c ti?p vo vng da cnh tay pha trn c?a qu v? ngang v?i m?c tim. Huy?t p c?n ???c ?o t nh?t hai l?n trn cng m?t cnh tay. M?t s? tnh tr?ng nh?t ??nh c th? lm cho huy?t p khc nhau gi?a tay ph?i v tay tri c?a qu v?. N?u qu v? c ch? s? huy?t p cao trong m?t l?n khm ho?c qu v? c huy?t p bnh th??ng c km cc y?u t? nguy c? khc, qu v? c th? ???c yu c?u: Tr? l?i vo m?t ngy khc ?? ki?m tra l?i huy?t p. Theo di huy?t p t?i nh trong vng 1 tu?n ho?c lu h?n. N?u qu v? ???c ch?n ?on b? t?ng huy?t p, qu v? c th? c?n th?c hi?n cc xt nghi?m mu ho?c ki?m tra hnh ?nh khc ?? gip chuyn gia ch?m [place] s?c kh?e hi?u nguy c? t?ng th? m?c cc b?nh  tr?ng khc. Tnh tr?ng ny ???c ?i?u tr? nh? th? no? Tnh tr?ng ny ???c ?i?u tr? b?ng cch thay ??i l?i s?ng lnh m?nh, ch?ng h?nh nh? ?n th?c ph?m c l?i cho s?c kh?e, t?p th? d?c nhi?u h?n v gi?m l??ng r??u u?ng vo. Qu v? c th? ???c gi?i thi?u ?? ???c t? v?n v? ch? ?? ?n lnh m?nh v ho?t ??ng th? ch?t. Chuyn gia ch?m [place] s?c kh?e c th? k ??n thu?c n?u thay ??i l?i s?ng khng ?? ?? ??a huy?t p v? m?c c th? ki?m sot ???c v n?u: Huy?t p tm thu c?a qu v? trn 130. Huy?t p tm tr??ng c?a qu v? trn 80. Huy?t p m?c tiu c nhn c?a qu v? c th? khc nhau ty thu?c v tnh tr?ng b?nh l, tu?i v cc y?u t? khc. Tun th? nh?ng h??ng d?n ny ? nh: ?n v u?ng  ?n ch? ?? giu ch?t x? v kali v t natri, ???ng ph? gia v ch?t bo. M?t v d? v? k? ho?ch ?n ki?u ny ???c g?i l ch? ?? ?n DASH. DASH l vi?t t?t c?a Dietary Approaches to Stop Hypertension (Ph??ng php ti?p c?n ch? ?? ?n u?ng ?? lm gi?m huy?t p). ?n theo cch ny: ?n nhi?u tri cy v rau t??i. Vo m?i b?a ?n, c? g?ng dnh m?t n?a ??a cho tri cy v rau c?. ?n ng? c?c nguyn cm, ch?ng h?n nh? m ?ng lm t? b?t m nguyn cm, g?o l?t, ho?c bnh m t? b?t m nguyn cm.  Cho ngu? c?c nguyn ca?m va?o kho?ng m?t ph?n t? ??a. ?n ho?c hu?ng cc s?n ph?m t? s?a t bo, ch?ng h?n nh? s?a ? b? kem ho?c s?a chua t bo. Trnh nh?ng mi?ng th?t nhi?u m?, th?t ? qua ch? bi?n ho?c th?t ??p mu?i v th?t gia c?m c da. Dnh kho?ng m?t ph?n t? ??a c?a qu v? cho cc protein khng m?, ch?ng h?n nh? c, th?t g khng da, ??u, tr?ng, ho?c ??u ph?. Trnh nh?ng th?c ph?m ch? bi?n ho?c lm s?n. Nh?ng th?c ph?m ny th??ng c nhi?u natri, b? sung ???ng v ch?t bo h?n. Gi?m l??ng dng natri hng ngy c?a qu v?. Nhi?u ng??i b? t?ng huy?t p c?n ?n d??i 1.500 mg natri m?i ngy. Khng u?ng r??u n?u: Chuyn gia ch?m [place] s?c kh?e khuyn qu v? khng u?ng r??u. Qu v? c [place], c th? c [place], ho?c ?ang c k? ho?ch c [place]. N?u qu v? u?ng r??u: Gi?i h?n l??ng r??u qu v? u?ng ? m?c: 0-1 ly/ngy ??i v?i n? gi?i. 0-2 ly/ngy ??i v?i nam gi?i. Bi?t m?t ly c bao nhiu r??u. ? M?, m?t ly t??ng ???ng v?i m?t chai bia 12 ao x? (355 mL), m?t ly r??u vang 5 ao x? (148 mL), ho?c m?t ly r??u m?nh 1 ao x? (44 mL). L?i s?ng  H?p tc v?i chuyn gia ch?m [place] s?c kh?e c?a qu v? ?? duy tr tr?ng l??ng c? th? c l?i cho s?c kh?e ho?c ?? gi?m cn. Hy h?i xem tr?ng l??ng no l l t??ng cho qu v?. Dnh t nh?t 30 pht t?p th? d?c c th? khi?n tim qu v? ??p nhanh h?n (t?p th? d?c nh?p ?i?u) h?u h?t cc ngy trong tu?n. Cc ho?t ??ng c th? bao g?m ?i b?, b?i, ho?c ??p xe. Bao g?m bi t?p t?ng c??ng c? (bi t?p khng l?c), ch?ng h?n nh? bi t?p [place] ho?c nng t?, nh? m?t ph?n c?a  thi quen luy?n t?p hng tu?n c?a qu v?. C? g?ng t?p nh?ng lo?i bi t?p ny trong vng 30 pht, t nh?t l 3 ngy m?i tu?n. Khng s? d?ng b?t k? s?n ph?m no c nicotine ho?c thu?c l. Nh?ng s?n ph?m ny bao g?m thu?c l d?ng ht, thu?c l d?ng nhai v d?ng c? ht thu?c, ch?ng h?n nh? thu?c l ?i?n t?. N?u qu v? c?n gip ?? ?? cai thu?c, hy h?i chuyn gia ch?m [place] s?c kh?e. Theo di huy?t p c?a qu v? t?i nh theo h??ng  d?n c?a chuyn gia ch?m [place] s?c kh?e. Tun th? theo t?t c? cc l?n khm l?i. ?i?u ny c vai tr quan tr?ng. Thu?c Ch? s? d?ng thu?c khng k ??n v thu?c k ??n theo ch? d?n c?a chuyn gia ch?m [place] s?c kh?e. Lm theo ch? d?n m?t cch c?n th?n. Thu?c ?i?u tr? huy?t p ph?i ???c dng theo ??n ? k. Khng b? cc li?u thu?c huy?t p. Vi?c b? dng thu?c khi?n qu v? c nguy c? g?p ph?i cc v?n ?? v c th? lm cho thu?c gi?m hi?u qu?Marland Kitchen Hy h?i chuyn gia ch?m [place] s?c kh?e c?a qu v? v? nh?ng tc d?ng ph? ho?c ph?n ?ng v?i thu?c m qu v? ph?i theo di. Hy lin l?c v?i chuyn gia ch?m [place] s?c kh?e n?u qu v?: Ngh? qu v? c ph?n ?ng v?i thu?c ?ang dng. B? ?au ??u ti?p t?c tr? l?i (ti pht). C?m th?y chng m?t. B? s?ng ph ? c? chn. C v?n ?? v? th? l?c. Yu c?u tr? gip ngay l?p t?c n?u qu v?: B? ?au ??u r?t nhi?u ho?c l l?n. B? y?u ho?c t b b?t th??ng. C?m th?y ng?t x?u. B? ?au r?t nhi?u ? ng?c ho?c b?ng. Nn nhi?u l?n. B? kh th?. Nh?ng tri?u ch?ng ny c th? l tr??ng h?p c?p c?u. Yu c?u tr? gip ngay l?p t?c. Hy g?i 911. Khng ch? xem tri?u ch?ng c h?t khng. Khng t? li xe ??n b?nh vi?n. Tm t?t T?ng huy?t p l khi l?c b?m mu qua cc ??ng m?ch c?a qu v? qu m?nh. N?u tnh tr?ng ny khng ???c ki?m sot, n c th? khi?n qu v? c nguy c? b? cc bi?n ch?ng nghim tr?ng. Huy?t p m?c tiu c nhn c?a qu v? c th? khc nhau ty thu?c v tnh tr?ng b?nh l, tu?i v cc y?u t? khc. ??i v?i h?u h?t m?i ng??i, huy?t p bnh th??ng l d??i 120/80. ?i?u tr? t?ng huy?t p b?ng cch thay ??i l?i s?ng, dng thu?c, ho?c k?t h?p c? hai. Thay ??i l?i s?ng bao g?m gi?m cn, ?n ch? ?? ?n c l?i cho s?c kh?e, t natri, t?p th? d?c nhi?u h?n v h?n ch? u?ng r??u. Thng tin ny khng nh?m m?c ?ch thay th? cho l?i khuyn m chuyn gia ch?m [place] s?c kh?e ni v?i qu v?. Hy b?o ??m qu v? ph?i th?o lu?n b?t k? v?n ?? g m qu v? c v?i chuyn gia ch?m [place] s?c kh?e c?a qu v?. Document Revised:  08/15/2021 Document Reviewed: 08/15/2021 Elsevier Patient Education  2024 [employer].

## 2023-12-21 NOTE — Assessment & Plan Note (Signed)
 Encouraged diet and exercise for weight loss ?

## 2023-12-21 NOTE — Progress Notes (Signed)
 Subjective:    Patient ID: Kevin Graham, male    DOB: 1971-01-10, 53 y.o.   MRN: [id number]  HPI  Discussed the use of AI scribe software for clinical note transcription with the patient, who gave verbal consent to proceed.  History of Present Illness Kevin Graham is a 53 year old male who presents with elevated blood pressure noted at a dental visit. He was referred by his dentist for evaluation of elevated blood pressure.  His blood pressure was measured at 145/88 mmHg during a dental visit last Tuesday. He has not previously been diagnosed with hypertension, and his blood pressure was not elevated during today's visit.  No lightheadedness, blurred vision, or swelling in his legs. He experiences occasional dizziness when bending forward or getting up to quickly.  He has made dietary changes since last week, including increased consumption of vegetables and avoidance of meat.  He smokes daily, which may contribute to elevated blood pressure.     Review of Systems     Past Medical History:  Diagnosis Date   Environmental allergies    History of stomach ulcers    Seasonal allergies     Current Outpatient Medications  Medication Sig Dispense Refill   azelastine (OPTIVAR) 0.05 % ophthalmic solution Place 1 drop into both eyes daily as needed (for itchy/watery eyes). As needed 6 mL 5   ezetimibe (ZETIA) 10 MG tablet TAKE 1 TABLET BY MOUTH EVERY DAY 30 tablet 5   famotidine (PEPCID) 10 MG tablet Take 1 tablet (10 mg total) by mouth 2 (two) times daily. 180 tablet 1   rosuvastatin (CRESTOR) 10 MG tablet TAKE 1 TABLET BY MOUTH EVERY DAY 30 tablet 5   aspirin EC 81 MG tablet Take 1 tablet (81 mg total) by mouth daily. Swallow whole. (Patient not taking: Reported on 12/21/2023)     No current facility-administered medications for this visit.    No Known Allergies  Family History  Problem Relation Age of Onset   Hyperlipidemia Mother     Social History   Socioeconomic History    Marital status: Single    Spouse name: Not on file   Number of children: Not on file   Years of education: Not on file   Highest education level: Not on file  Occupational History   Not on file  Tobacco Use   Smoking status: Every Day    Current packs/day: 0.50    Average packs/day: 0.5 packs/day for 38.0 years (19.0 ttl pk-yrs)    Types: Cigarettes   Smokeless tobacco: Never   Tobacco comments:            Vaping Use   Vaping status: Never Used  Substance and Sexual Activity   Alcohol use: Yes    Alcohol/week: 3.0 standard drinks of alcohol    Types: 3 Cans of beer per week    Comment: 1-3 beers during the week, more on the weekends   Drug use: No   Sexual activity: Yes    Partners: Female  Other Topics Concern   Not on file  Social History Narrative   Lives with girlfriend and her child in a one bedroom home.  Has 5 children.  Works at [employer].  Education: high school.   Social Drivers of Corporate investment banker Strain: Not on file  Food Insecurity: Not on file  Transportation Needs: Not on file  Physical Activity: Not on file  Stress: Not on file  Social Connections: Not on file  Intimate Partner Violence: Not on file     Constitutional: Denies fever, malaise, fatigue, headache or abrupt weight changes.  HEENT: Denies eye pain, eye redness, ear pain, ringing in the ears, wax buildup, runny nose, nasal congestion, bloody nose, or sore throat. Respiratory: Denies difficulty breathing, shortness of breath, cough or sputum production.   Cardiovascular: Denies chest pain, chest tightness, palpitations or swelling in the hands or feet.  Gastrointestinal: Denies abdominal pain, bloating, constipation, diarrhea or blood in the stool.  GU: Denies urgency, frequency, pain with urination, burning sensation, blood in urine, odor or discharge. Musculoskeletal: Denies decrease in range of motion, difficulty with gait, muscle pain or joint pain and swelling.  Skin:  Denies redness, rashes, lesions or ulcercations.  Neurological: Pt reports intermittent dizziness. Denies difficulty with memory, difficulty with speech or problems with balance and coordination.  Psych: Denies anxiety, depression, SI/HI.  No other specific complaints in a complete review of systems (except as listed in HPI above).  Objective:   Physical Exam   BP 132/84 (BP Location: Left Arm, Patient Position: Sitting, Cuff Size: Normal)   Ht 5\' 4"  (1.626 m)   Wt 158 lb 3.2 oz (71.8 kg)   BMI 27.15 kg/m  Wt Readings from Last 3 Encounters:  12/21/23 158 lb 3.2 oz (71.8 kg)  10/21/23 161 lb 6.4 oz (73.2 kg)  08/12/23 162 lb 3.2 oz (73.6 kg)    General: Appears his stated age, overweight, in NAD. Skin: Warm, dry and intact.  Cardiovascular: Normal rate and rhythm. S1,S2 noted.  No murmur, rubs or gallops noted. No JVD or BLE edema. No carotid bruits noted. Pulmonary/Chest: Normal effort and positive vesicular breath sounds. No respiratory distress. No wheezes, rales or ronchi noted.  Musculoskeletal: No difficulty with gait.  Neurological: Alert and oriented.  Coordination normal.   BMET    Component Value Date/Time   NA 139 08/12/2023 0918   K 4.2 08/12/2023 0918   CL 107 08/12/2023 0918   CO2 26 08/12/2023 0918   GLUCOSE 109 (H) 08/12/2023 0918   BUN 17 08/12/2023 0918   CREATININE 0.83 08/12/2023 0918   CALCIUM 10.2 08/12/2023 0918   GFRNONAA >60 04/06/2017 2018   GFRAA >60 04/06/2017 2018    Lipid Panel     Component Value Date/Time   CHOL 253 (H) 08/12/2023 0918   TRIG 401 (H) 08/12/2023 0918   HDL 43 08/12/2023 0918   CHOLHDL 5.9 (H) 08/12/2023 0918   VLDL 41.8 (H) 02/16/2020 0825   LDLCALC  08/12/2023 0918     Comment:     . LDL cholesterol not calculated. Triglyceride levels greater than 400 mg/dL invalidate calculated LDL results. . Reference range: <100 . Desirable range <100 mg/dL for primary prevention;   <70 mg/dL for patients with CHD or  diabetic patients  with > or = 2 CHD risk factors. Marland Kitchen LDL-C is now calculated using the Martin-Hopkins  calculation, which is a validated novel method providing  better accuracy than the Friedewald equation in the  estimation of LDL-C.  Horald Pollen et al. Lenox Ahr. 7829;562(13): 2061-2068  (http://education.[employer].com/faq/FAQ164)     CBC    Component Value Date/Time   WBC 5.0 08/12/2023 0918   RBC 4.96 08/12/2023 0918   HGB 16.0 08/12/2023 0918   HCT 47.6 08/12/2023 0918   PLT 165 08/12/2023 0918   MCV 96.0 08/12/2023 0918   MCH 32.3 08/12/2023 0918   MCHC 33.6 08/12/2023 0918   RDW 12.3 08/12/2023 0918    Hgb A1C  Lab Results  Component Value Date   HGBA1C 5.9 (H) 08/12/2023           Assessment & Plan:   Assessment and Plan Assessment & Plan Hypertension Blood pressure elevated at dental visit, normal today. Occasional dizziness likely due to fluctuations. Smoking contributes to hypertension. Explained effects of nervousness and smoking on blood pressure. Initiated olmesartan to manage hypertension and prevent complications. - Prescribe olmesartan 20 mg once daily in the morning. -Reinforced DASH diet and exercise for weight loss -Encouraged smoking cessation    RTC in 1months for your annual exam/followup HTN Nicki Reaper, NP

## 2024-01-26 ENCOUNTER — Encounter: Payer: 59 | Admitting: Internal Medicine

## 2024-01-26 NOTE — Progress Notes (Deleted)
 Subjective:    Patient ID: Kevin Graham, male    DOB: [birth year], 53 y.o.   MRN: [id number]  HPI  Patient presents to clinic today for his annual exam.  Flu: 07/2023 Tetanus: 05/2016 COVID: Moderna x 2 Shingrix: 07/2023 PSA screening: 12/2022 Colon screening: 01/2022 Vision screening: as needed Dentist: biannually  Diet: He does eat meat. He consumes fruits and veggies. He does eat fried foods. He drinks mostly water . Exercise: None  Review of Systems     Past Medical History:  Diagnosis Date   Environmental allergies    History of stomach ulcers    Seasonal allergies     Current Outpatient Medications  Medication Sig Dispense Refill   aspirin  EC 81 MG tablet Take 1 tablet (81 mg total) by mouth daily. Swallow whole. (Patient not taking: Reported on 12/21/2023)     azelastine  (OPTIVAR ) 0.05 % ophthalmic solution Place 1 drop into both eyes daily as needed (for itchy/watery eyes). As needed 6 mL 5   ezetimibe  (ZETIA ) 10 MG tablet TAKE 1 TABLET BY MOUTH EVERY DAY 30 tablet 5   famotidine  (PEPCID ) 10 MG tablet Take 1 tablet (10 mg total) by mouth 2 (two) times daily. 180 tablet 1   olmesartan  (BENICAR ) 20 MG tablet Take 1 tablet (20 mg total) by mouth daily. 90 tablet 1   rosuvastatin  (CRESTOR ) 10 MG tablet TAKE 1 TABLET BY MOUTH EVERY DAY 30 tablet 5   No current facility-administered medications for this visit.    No Known Allergies  Family History  Problem Relation Age of Onset   Hyperlipidemia Mother     Social History   Socioeconomic History   Marital status: Single    Spouse name: Not on file   Number of children: Not on file   Years of education: Not on file   Highest education level: Not on file  Occupational History   Not on file  Tobacco Use   Smoking status: Every Day    Current packs/day: 0.50    Average packs/day: 0.5 packs/day for 38.0 years (19.0 ttl pk-yrs)    Types: Cigarettes   Smokeless tobacco: Never   Tobacco comments:             Vaping Use   Vaping status: Never Used  Substance and Sexual Activity   Alcohol use: Yes    Alcohol/week: 3.0 standard drinks of alcohol    Types: 3 Cans of beer per week    Comment: 1-3 beers during the week, more on the weekends   Drug use: No   Sexual activity: Yes    Partners: Female  Other Topics Concern   Not on file  Social History Narrative   Lives with girlfriend and her child in a one bedroom home.  Has 5 children.  Works at [employer].  Education: high school.   Social Drivers of Corporate investment banker Strain: Not on file  Food Insecurity: Not on file  Transportation Needs: Not on file  Physical Activity: Not on file  Stress: Not on file  Social Connections: Not on file  Intimate Partner Violence: Not on file     Constitutional: Denies fever, malaise, fatigue, headache or abrupt weight changes.  HEENT: Denies eye pain, eye redness, ear pain, ringing in the ears, wax buildup, runny nose, nasal congestion, bloody nose, or sore throat. Respiratory: Denies difficulty breathing, shortness of breath, cough or sputum production.   Cardiovascular: Denies chest pain, chest tightness, palpitations or swelling in the hands  or feet.  Gastrointestinal: Denies abdominal pain, bloating, constipation, diarrhea or blood in the stool.  GU: Denies urgency, frequency, pain with urination, burning sensation, blood in urine, odor or discharge. Musculoskeletal: Denies decrease in range of motion, difficulty with gait, muscle pain or joint pain and swelling.  Skin: Denies redness, rashes, lesions or ulcercations.  Neurological: Denies dizziness, difficulty with memory, difficulty with speech or problems with balance and coordination.  Psych: Denies anxiety, depression, SI/HI.  No other specific complaints in a complete review of systems (except as listed in HPI above).  Objective:   Physical Exam   There were no vitals taken for this visit.  Wt Readings from Last 3 Encounters:   12/21/23 158 lb 3.2 oz (71.8 kg)  10/21/23 161 lb 6.4 oz (73.2 kg)  08/12/23 162 lb 3.2 oz (73.6 kg)    General: Appears his stated age, overweight, in NAD. Skin: Warm, dry and intact.  HEENT: Head: normal shape and size; Eyes: sclera white, no icterus, conjunctiva pink, PERRLA and EOMs intact;  Neck:  Neck supple, trachea midline. No masses, lumps or thyromegaly present.  Cardiovascular: Normal rate and rhythm. S1,S2 noted.  No murmur, rubs or gallops noted. No JVD or BLE edema. No carotid bruits noted. Pulmonary/Chest: Normal effort and positive vesicular breath sounds. No respiratory distress. No wheezes, rales or ronchi noted.  Abdomen: Normal bowel sounds.  Musculoskeletal: Strength 5/5 BUE/BLE. No difficulty with gait.  Neurological: Alert and oriented. Cranial nerves II-XII grossly intact. Coordination normal.  Psychiatric: Mood and affect normal. Behavior is normal. Judgment and thought content normal.    BMET    Component Value Date/Time   NA 139 08/12/2023 0918   K 4.2 08/12/2023 0918   CL 107 08/12/2023 0918   CO2 26 08/12/2023 0918   GLUCOSE 109 (H) 08/12/2023 0918   BUN 17 08/12/2023 0918   CREATININE 0.83 08/12/2023 0918   CALCIUM  10.2 08/12/2023 0918   GFRNONAA >60 04/06/2017 2018   GFRAA >60 04/06/2017 2018    Lipid Panel     Component Value Date/Time   CHOL 253 (H) 08/12/2023 0918   TRIG 401 (H) 08/12/2023 0918   HDL 43 08/12/2023 0918   CHOLHDL 5.9 (H) 08/12/2023 0918   VLDL 41.8 (H) 02/16/2020 0825   LDLCALC  08/12/2023 0918     Comment:     . LDL cholesterol not calculated. Triglyceride levels greater than 400 mg/dL invalidate calculated LDL results. . Reference range: <100 . Desirable range <100 mg/dL for primary prevention;   <70 mg/dL for patients with CHD or diabetic patients  with > or = 2 CHD risk factors. Aaron Aas LDL-C is now calculated using the Martin-Hopkins  calculation, which is a validated novel method providing  better accuracy than  the Friedewald equation in the  estimation of LDL-C.  Melinda Sprawls et al. Erroll Heard. 4098;119(14): 2061-2068  (http://education.[facility].com/faq/FAQ164)     CBC    Component Value Date/Time   WBC 5.0 08/12/2023 0918   RBC 4.96 08/12/2023 0918   HGB 16.0 08/12/2023 0918   HCT 47.6 08/12/2023 0918   PLT 165 08/12/2023 0918   MCV 96.0 08/12/2023 0918   MCH 32.3 08/12/2023 0918   MCHC 33.6 08/12/2023 0918   RDW 12.3 08/12/2023 0918    Hgb A1C Lab Results  Component Value Date   HGBA1C 5.9 (H) 08/12/2023          Assessment & Plan:   Preventative Health Maintenance:  Encouraged him to get a flu shot in the  fall Tetanus UTD Encouraged him to get his COVID booster Discussed Shingrix vaccine, he will check coverage with his insurance company and schedule a visit if he would like to have this done Colon screening UTD Encouraged him to consume a balanced diet and exercise regimen Advised him to see an eye doctor and dentist annually We will check CBC, c-Met, lipid, A1c and PSA today  RTC in 6 months, follow-up chronic conditions Helayne Lo, NP

## 2024-02-05 ENCOUNTER — Ambulatory Visit (INDEPENDENT_AMBULATORY_CARE_PROVIDER_SITE_OTHER): Admitting: Internal Medicine

## 2024-02-05 ENCOUNTER — Encounter: Payer: Self-pay | Admitting: Internal Medicine

## 2024-02-05 VITALS — BP 134/84 | Ht 64.0 in | Wt 159.6 lb

## 2024-02-05 DIAGNOSIS — E663 Overweight: Secondary | ICD-10-CM

## 2024-02-05 DIAGNOSIS — E782 Mixed hyperlipidemia: Secondary | ICD-10-CM

## 2024-02-05 DIAGNOSIS — Z125 Encounter for screening for malignant neoplasm of prostate: Secondary | ICD-10-CM | POA: Diagnosis not present

## 2024-02-05 DIAGNOSIS — Z0001 Encounter for general adult medical examination with abnormal findings: Secondary | ICD-10-CM | POA: Diagnosis not present

## 2024-02-05 DIAGNOSIS — R739 Hyperglycemia, unspecified: Secondary | ICD-10-CM | POA: Diagnosis not present

## 2024-02-05 DIAGNOSIS — Z23 Encounter for immunization: Secondary | ICD-10-CM

## 2024-02-05 DIAGNOSIS — Z6827 Body mass index (BMI) 27.0-27.9, adult: Secondary | ICD-10-CM | POA: Diagnosis not present

## 2024-02-05 MED ORDER — ROSUVASTATIN CALCIUM 10 MG PO TABS: 10.0000 mg | ORAL_TABLET | Freq: Every day | ORAL | 1 refills | Status: DC

## 2024-02-05 NOTE — Progress Notes (Signed)
 Subjective:    Patient ID: Kevin Graham, male    DOB: [birth year], 53 y.o.   MRN: [id number]  HPI  Patient presents to clinic today for his annual exam.  Flu: 07/2023 Tetanus: 05/2016 COVID: Moderna x 2 Shingrix: 07/2023 PSA screening: 12/2022 Colon screening: 01/2022 Vision screening: as needed Dentist: biannually  Diet: He does eat meat. He consumes fruits and veggies. He does eat fried foods. He drinks mostly water . Exercise: None  Review of Systems     Past Medical History:  Diagnosis Date   Environmental allergies    History of stomach ulcers    Seasonal allergies     Current Outpatient Medications  Medication Sig Dispense Refill   aspirin  EC 81 MG tablet Take 1 tablet (81 mg total) by mouth daily. Swallow whole. (Patient not taking: Reported on 12/21/2023)     azelastine  (OPTIVAR ) 0.05 % ophthalmic solution Place 1 drop into both eyes daily as needed (for itchy/watery eyes). As needed 6 mL 5   ezetimibe  (ZETIA ) 10 MG tablet TAKE 1 TABLET BY MOUTH EVERY DAY 30 tablet 5   famotidine  (PEPCID ) 10 MG tablet Take 1 tablet (10 mg total) by mouth 2 (two) times daily. 180 tablet 1   olmesartan  (BENICAR ) 20 MG tablet Take 1 tablet (20 mg total) by mouth daily. 90 tablet 1   rosuvastatin  (CRESTOR ) 10 MG tablet TAKE 1 TABLET BY MOUTH EVERY DAY 30 tablet 5   No current facility-administered medications for this visit.    No Known Allergies  Family History  Problem Relation Age of Onset   Hyperlipidemia Mother     Social History   Socioeconomic History   Marital status: Single    Spouse name: Not on file   Number of children: Not on file   Years of education: Not on file   Highest education level: Not on file  Occupational History   Not on file  Tobacco Use   Smoking status: Every Day    Current packs/day: 0.50    Average packs/day: 0.5 packs/day for 38.0 years (19.0 ttl pk-yrs)    Types: Cigarettes   Smokeless tobacco: Never   Tobacco comments:             Vaping Use   Vaping status: Never Used  Substance and Sexual Activity   Alcohol use: Yes    Alcohol/week: 3.0 standard drinks of alcohol    Types: 3 Cans of beer per week    Comment: 1-3 beers during the week, more on the weekends   Drug use: No   Sexual activity: Yes    Partners: Female  Other Topics Concern   Not on file  Social History Narrative   Lives with girlfriend and her child in a one bedroom home.  Has 5 children.  Works at [employer].  Education: high school.   Social Drivers of Corporate investment banker Strain: Not on file  Food Insecurity: Not on file  Transportation Needs: Not on file  Physical Activity: Not on file  Stress: Not on file  Social Connections: Not on file  Intimate Partner Violence: Not on file     Constitutional: Denies fever, malaise, fatigue, headache or abrupt weight changes.  HEENT: Denies eye pain, eye redness, ear pain, ringing in the ears, wax buildup, runny nose, nasal congestion, bloody nose, or sore throat. Respiratory: Denies difficulty breathing, shortness of breath, cough or sputum production.   Cardiovascular: Denies chest pain, chest tightness, palpitations or swelling in the hands  or feet.  Gastrointestinal: Patient reports blood in stool when he takes aspirin .  Denies abdominal pain, bloating, constipation, diarrhea.  GU: Denies urgency, frequency, pain with urination, burning sensation, blood in urine, odor or discharge. Musculoskeletal: Denies decrease in range of motion, difficulty with gait, muscle pain or joint pain and swelling.  Skin: Denies redness, rashes, lesions or ulcercations.  Neurological: Denies dizziness, difficulty with memory, difficulty with speech or problems with balance and coordination.  Psych: Denies anxiety, depression, SI/HI.  No other specific complaints in a complete review of systems (except as listed in HPI above).  Objective:   Physical Exam   BP 134/84 (BP Location: Left Arm, Patient  Position: Sitting, Cuff Size: Normal)   Ht 5\' 4"  (1.626 m)   Wt 159 lb 9.6 oz (72.4 kg)   BMI 27.40 kg/m    Wt Readings from Last 3 Encounters:  12/21/23 158 lb 3.2 oz (71.8 kg)  10/21/23 161 lb 6.4 oz (73.2 kg)  08/12/23 162 lb 3.2 oz (73.6 kg)    General: Appears his stated age, overweight, in NAD. Skin: Warm, dry and intact.  HEENT: Head: normal shape and size; Eyes: sclera white, no icterus, conjunctiva pink, PERRLA and EOMs intact;  Neck:  Neck supple, trachea midline. No masses, lumps or thyromegaly present.  Cardiovascular: Normal rate and rhythm. S1,S2 noted.  No murmur, rubs or gallops noted. No JVD or BLE edema. No carotid bruits noted. Pulmonary/Chest: Normal effort and positive vesicular breath sounds. No respiratory distress. No wheezes, rales or ronchi noted.  Abdomen: Normal bowel sounds.  Musculoskeletal: Strength 5/5 BUE/BLE. No difficulty with gait.  Neurological: Alert and oriented. Cranial nerves II-XII grossly intact. Coordination normal.  Psychiatric: Mood and affect normal. Behavior is normal. Judgment and thought content normal.    BMET    Component Value Date/Time   NA 139 08/12/2023 0918   K 4.2 08/12/2023 0918   CL 107 08/12/2023 0918   CO2 26 08/12/2023 0918   GLUCOSE 109 (H) 08/12/2023 0918   BUN 17 08/12/2023 0918   CREATININE 0.83 08/12/2023 0918   CALCIUM  10.2 08/12/2023 0918   GFRNONAA >60 04/06/2017 2018   GFRAA >60 04/06/2017 2018    Lipid Panel     Component Value Date/Time   CHOL 253 (H) 08/12/2023 0918   TRIG 401 (H) 08/12/2023 0918   HDL 43 08/12/2023 0918   CHOLHDL 5.9 (H) 08/12/2023 0918   VLDL 41.8 (H) 02/16/2020 0825   LDLCALC  08/12/2023 0918     Comment:     . LDL cholesterol not calculated. Triglyceride levels greater than 400 mg/dL invalidate calculated LDL results. . Reference range: <100 . Desirable range <100 mg/dL for primary prevention;   <70 mg/dL for patients with CHD or diabetic patients  with > or = 2  CHD risk factors. Aaron Aas LDL-C is now calculated using the Martin-Hopkins  calculation, which is a validated novel method providing  better accuracy than the Friedewald equation in the  estimation of LDL-C.  Melinda Sprawls et al. Erroll Heard. 1610;960(45): 2061-2068  (http://education.[facility].com/faq/FAQ164)     CBC    Component Value Date/Time   WBC 5.0 08/12/2023 0918   RBC 4.96 08/12/2023 0918   HGB 16.0 08/12/2023 0918   HCT 47.6 08/12/2023 0918   PLT 165 08/12/2023 0918   MCV 96.0 08/12/2023 0918   MCH 32.3 08/12/2023 0918   MCHC 33.6 08/12/2023 0918   RDW 12.3 08/12/2023 0918    Hgb A1C Lab Results  Component Value Date  HGBA1C 5.9 (H) 08/12/2023          Assessment & Plan:   Preventative Health Maintenance:  Encouraged him to get a flu shot in the fall Tetanus UTD Encouraged him to get his COVID booster Second Shingrix vaccine today Colon screening UTD Encouraged him to consume a balanced diet and exercise regimen Advised him to see an eye doctor and dentist annually We will check CBC, c-Met, lipid, A1c and PSA today  RTC in 6 months, follow-up chronic conditions Helayne Lo, NP

## 2024-02-05 NOTE — Assessment & Plan Note (Signed)
 Encouraged diet and exercise for weight loss ?

## 2024-02-05 NOTE — Patient Instructions (Signed)
 Health Maintenance, Male  Adopting a healthy lifestyle and getting preventive care are important in promoting health and wellness. Ask your health care provider about:  The right schedule for you to have regular tests and exams.  Things you can do on your own to prevent diseases and keep yourself healthy.  What should I know about diet, weight, and exercise?  Eat a healthy diet    Eat a diet that includes plenty of vegetables, fruits, low-fat dairy products, and lean protein.  Do not eat a lot of foods that are high in solid fats, added sugars, or sodium.  Maintain a healthy weight  Body mass index (BMI) is a measurement that can be used to identify possible weight problems. It estimates body fat based on height and weight. Your health care provider can help determine your BMI and help you achieve or maintain a healthy weight.  Get regular exercise  Get regular exercise. This is one of the most important things you can do for your health. Most adults should:  Exercise for at least 150 minutes each week. The exercise should increase your heart rate and make you sweat (moderate-intensity exercise).  Do strengthening exercises at least twice a week. This is in addition to the moderate-intensity exercise.  Spend less time sitting. Even light physical activity can be beneficial.  Watch cholesterol and blood lipids  Have your blood tested for lipids and cholesterol at 53 years of age, then have this test every 5 years.  You may need to have your cholesterol levels checked more often if:  Your lipid or cholesterol levels are high.  You are older than 53 years of age.  You are at high risk for heart disease.  What should I know about cancer screening?  Many types of cancers can be detected early and may often be prevented. Depending on your health history and family history, you may need to have cancer screening at various ages. This may include screening for:  Colorectal cancer.  Prostate cancer.  Skin cancer.  Lung  cancer.  What should I know about heart disease, diabetes, and high blood pressure?  Blood pressure and heart disease  High blood pressure causes heart disease and increases the risk of stroke. This is more likely to develop in people who have high blood pressure readings or are overweight.  Talk with your health care provider about your target blood pressure readings.  Have your blood pressure checked:  Every 3-5 years if you are 9-95 years of age.  Every year if you are 85 years old or older.  If you are between the ages of 29 and 29 and are a current or former smoker, ask your health care provider if you should have a one-time screening for abdominal aortic aneurysm (AAA).  Diabetes  Have regular diabetes screenings. This checks your fasting blood sugar level. Have the screening done:  Once every three years after age 23 if you are at a normal weight and have a low risk for diabetes.  More often and at a younger age if you are overweight or have a high risk for diabetes.  What should I know about preventing infection?  Hepatitis B  If you have a higher risk for hepatitis B, you should be screened for this virus. Talk with your health care provider to find out if you are at risk for hepatitis B infection.  Hepatitis C  Blood testing is recommended for:  Everyone born from [birth year] through 1965.  Anyone  with known risk factors for hepatitis C.  Sexually transmitted infections (STIs)  You should be screened each year for STIs, including gonorrhea and chlamydia, if:  You are sexually active and are younger than 53 years of age.  You are older than 53 years of age and your health care provider tells you that you are at risk for this type of infection.  Your sexual activity has changed since you were last screened, and you are at increased risk for chlamydia or gonorrhea. Ask your health care provider if you are at risk.  Ask your health care provider about whether you are at high risk for HIV. Your health care provider  may recommend a prescription medicine to help prevent HIV infection. If you choose to take medicine to prevent HIV, you should first get tested for HIV. You should then be tested every 3 months for as long as you are taking the medicine.  Follow these instructions at home:  Alcohol use  Do not drink alcohol if your health care provider tells you not to drink.  If you drink alcohol:  Limit how much you have to 0-2 drinks a day.  Know how much alcohol is in your drink. In the [place] one drink equals one 12 oz bottle of beer (355 mL), one 5 oz glass of wine (148 mL), or one 1 oz glass of hard liquor (44 mL).  Lifestyle  Do not use any products that contain nicotine or tobacco. These products include cigarettes, chewing tobacco, and vaping devices, such as e-cigarettes. If you need help quitting, ask your health care provider.  Do not use street drugs.  Do not share needles.  Ask your health care provider for help if you need support or information about quitting drugs.  General instructions  Schedule regular health, dental, and eye exams.  Stay current with your vaccines.  Tell your health care provider if:  You often feel depressed.  You have ever been abused or do not feel safe at home.  Summary  Adopting a healthy lifestyle and getting preventive care are important in promoting health and wellness.  Follow your health care provider's instructions about healthy diet, exercising, and getting tested or screened for diseases.  Follow your health care provider's instructions on monitoring your cholesterol and blood pressure.  This information is not intended to replace advice given to you by your health care provider. Make sure you discuss any questions you have with your health care provider.  Document Revised: 02/04/2021 Document Reviewed: 02/04/2021  Elsevier Patient Education  2024 [employer].

## 2024-02-06 ENCOUNTER — Encounter: Payer: Self-pay | Admitting: Internal Medicine

## 2024-02-08 LAB — COMPREHENSIVE METABOLIC PANEL WITH GFR
AG Ratio: 1.9 (calc) (ref 1.0–2.5)
ALT: 32 U/L (ref 9–46)
AST: 17 U/L (ref 10–35)
Albumin: 4.7 g/dL (ref 3.6–5.1)
Alkaline phosphatase (APISO): 53 U/L (ref 35–144)
BUN: 15 mg/dL (ref 7–25)
CO2: 29 mmol/L (ref 20–32)
Calcium: 10.4 mg/dL — ABNORMAL HIGH (ref 8.6–10.3)
Chloride: 105 mmol/L (ref 98–110)
Creat: 0.8 mg/dL (ref 0.70–1.30)
Globulin: 2.5 g/dL (ref 1.9–3.7)
Glucose, Bld: 99 mg/dL (ref 65–99)
Potassium: 4.4 mmol/L (ref 3.5–5.3)
Sodium: 138 mmol/L (ref 135–146)
Total Bilirubin: 0.6 mg/dL (ref 0.2–1.2)
Total Protein: 7.2 g/dL (ref 6.1–8.1)
eGFR: 106 mL/min/{1.73_m2} (ref >=60)

## 2024-02-08 LAB — LIPID PANEL
Cholesterol: 200 mg/dL — ABNORMAL HIGH (ref <200)
HDL: 43 mg/dL (ref >=40)
LDL Cholesterol (Calc): 116 mg/dL — ABNORMAL HIGH
Non-HDL Cholesterol (Calc): 157 mg/dL — ABNORMAL HIGH (ref <130)
Total CHOL/HDL Ratio: 4.7 (calc) (ref <5.0)
Triglycerides: 309 mg/dL — ABNORMAL HIGH (ref <150)

## 2024-02-08 LAB — CBC
HCT: 47.8 % (ref 38.5–50.0)
Hemoglobin: 16 g/dL (ref 13.2–17.1)
MCH: 32.3 pg (ref 27.0–33.0)
MCHC: 33.5 g/dL (ref 32.0–36.0)
MCV: 96.4 fL (ref 80.0–100.0)
MPV: 9.5 fL (ref 7.5–12.5)
Platelets: 160 10*3/uL (ref 140–400)
RBC: 4.96 10*6/uL (ref 4.20–5.80)
RDW: 12.4 % (ref 11.0–15.0)
WBC: 4.6 10*3/uL (ref 3.8–10.8)

## 2024-02-08 LAB — HEMOGLOBIN A1C
Hgb A1c MFr Bld: 5.8 % — ABNORMAL HIGH (ref <5.7)
Mean Plasma Glucose: 120 mg/dL
eAG (mmol/L): 6.6 mmol/L

## 2024-02-08 LAB — PSA: PSA: 2.87 ng/mL (ref <=4.00)

## 2024-03-17 ENCOUNTER — Other Ambulatory Visit: Payer: Self-pay | Admitting: Internal Medicine

## 2024-03-17 DIAGNOSIS — E782 Mixed hyperlipidemia: Secondary | ICD-10-CM

## 2024-03-18 NOTE — Telephone Encounter (Signed)
 Requested Prescriptions  Pending Prescriptions Disp Refills   ezetimibe  (ZETIA ) 10 MG tablet [Pharmacy Med Name: EZETIMIBE  10 MG TABLET] 30 tablet 5    Sig: TAKE 1 TABLET BY MOUTH EVERY DAY     Cardiovascular:  Antilipid - Sterol Transport Inhibitors Failed - 03/18/2024  3:31 PM      Failed - Lipid Panel in normal range within the last 12 months    Cholesterol  Date Value Ref Range Status  02/05/2024 200 (H) <200 mg/dL Final   LDL Cholesterol (Calc)  Date Value Ref Range Status  02/05/2024 116 (H) mg/dL (calc) Final    Comment:    Reference range: <100 . Desirable range <100 mg/dL for primary prevention;   <70 mg/dL for patients with CHD or diabetic patients  with > or = 2 CHD risk factors. Aaron Aas LDL-C is now calculated using the Martin-Hopkins  calculation, which is a validated novel method providing  better accuracy than the Friedewald equation in the  estimation of LDL-C.  Melinda Sprawls et al. Erroll Heard. 8469;629(52): 2061-2068  (http://education.[facility].com/faq/FAQ164)    Direct LDL  Date Value Ref Range Status  02/16/2020 118.0 mg/dL Final    Comment:    Optimal:  <100 mg/dLNear or Above Optimal:  100-129 mg/dLBorderline High:  130-159 mg/dLHigh:  160-189 mg/dLVery High:  >190 mg/dL   HDL  Date Value Ref Range Status  02/05/2024 43 > OR = 40 mg/dL Final   Triglycerides  Date Value Ref Range Status  02/05/2024 309 (H) <150 mg/dL Final    Comment:    . If a non-fasting specimen was collected, consider repeat triglyceride testing on a fasting specimen if clinically indicated.  Imagene Mam et al. J. of Clin. Lipidol. 2015;9:129-169. Aaron Aas          Passed - AST in normal range and within 360 days    AST  Date Value Ref Range Status  02/05/2024 17 10 - 35 U/L Final         Passed - ALT in normal range and within 360 days    ALT  Date Value Ref Range Status  02/05/2024 32 9 - 46 U/L Final         Passed - Patient is not pregnant      Passed - Valid encounter  within last 12 months    Recent Outpatient Visits           1 month ago Encounter for general adult medical examination with abnormal findings   [facility] [place], Rankin Buzzard, NP   2 months ago Primary hypertension   [place] [facility] [place], Rankin Buzzard, [place]

## 2024-08-05 ENCOUNTER — Ambulatory Visit (INDEPENDENT_AMBULATORY_CARE_PROVIDER_SITE_OTHER)

## 2024-08-05 ENCOUNTER — Ambulatory Visit
Admission: EM | Admit: 2024-08-05 | Discharge: 2024-08-05 | Disposition: A | Discharge status: Home / Self Care | Attending: Emergency Medicine | Admitting: Emergency Medicine

## 2024-08-05 ENCOUNTER — Telehealth: Payer: Self-pay | Admitting: Emergency Medicine

## 2024-08-05 DIAGNOSIS — M533 Sacrococcygeal disorders, not elsewhere classified: Secondary | ICD-10-CM | POA: Diagnosis not present

## 2024-08-05 MED ORDER — KETOROLAC TROMETHAMINE 30 MG/ML IJ SOLN
30.0000 mg | Freq: Once | INTRAMUSCULAR | Status: AC
Start: 2024-08-05 — End: 2024-08-05
  Administered 2024-08-05: 30 mg via INTRAMUSCULAR

## 2024-08-05 MED ORDER — PREDNISONE 10 MG (21) PO TBPK: ORAL_TABLET | Freq: Every day | ORAL | 0 refills | Status: DC

## 2024-08-05 MED ORDER — CYCLOBENZAPRINE HCL 10 MG PO TABS: 10.0000 mg | ORAL_TABLET | Freq: Every day | ORAL | 0 refills | Status: AC

## 2024-08-05 NOTE — Discharge Instructions (Addendum)
 X-ray is pending and you will be notified of results via telephone if there is a break in the bones and you will need to follow-up with orthopedic specialist information also for page, you will call to schedule an appointment  Today you were given injection of Toradol to help reduce inflammation and pain and ideally will see some improvement within an hour  Start tomorrow take prednisone  every morning with food as directed to continue the process, may take Tylenol or use any topical creams  You may use muscle relaxant at bedtime to allow for rest  You may use heating pad in 15 minute intervals as needed for additional comfort, within the first 2-3 days you may find comfort in using ice in 10-15 minutes over affected area  When sitting and lying down place pillow underneath the buttocks  If pain persist after recommended treatment or reoccurs if may be beneficial to follow up with orthopedic specialist for evaluation, this doctor specializes in the bones and can manage your symptoms long-term with options such as but not limited to imaging, medications or physical therapy

## 2024-08-05 NOTE — ED Provider Notes (Signed)
 Kevin Graham    CSN: [id number] Arrival date & time: 08/05/24  0836      History   Chief Complaint Chief Complaint  Patient presents with   Back Pain    HPI Kevin Graham is a 53 y.o. male.   Patient presents for evaluation of sacral pain present for 3 days beginning after fall.  Slipped and fell landing directly onto the buttocks.  Has had constant pain since, exacerbated by sitting and changing position but improves with standing.  Denies numbness or tingling.  Has attempted a topical cream which has been ineffective.  Past Medical History:  Diagnosis Date   Environmental allergies    History of stomach ulcers    Seasonal allergies     Patient Active Problem List   Diagnosis Date Noted   HTN (hypertension) 12/21/2023   Alcohol use disorder 08/12/2023   Overweight with body mass index (BMI) of 27 to 27.9 in adult 07/11/2021   Aortic atherosclerosis 07/11/2021   Elevated cholesterol with high triglycerides 02/04/2016   Gastroesophageal reflux disease without esophagitis 01/07/2016    Past Surgical History:  Procedure Laterality Date   COLONOSCOPY WITH PROPOFOL  N/A 01/29/2022   Procedure: COLONOSCOPY WITH PROPOFOL ;  Surgeon: Therisa Bi, MD;  Location: [facility] ENDOSCOPY;  Service: Gastroenterology;  Laterality: N/A;   NO PAST SURGERIES         Home Medications    Prior to Admission medications   Medication Sig Start Date End Date Taking? Authorizing Provider  cyclobenzaprine (FLEXERIL) 10 MG tablet Take 1 tablet (10 mg total) by mouth at bedtime. 08/05/24  Yes Zaela Graley R, NP  predniSONE  (STERAPRED UNI-PAK 21 TAB) 10 MG (21) TBPK tablet Take by mouth daily. Take 6 tabs by mouth daily  for 1 days, then 5 tabs for 1 days, then 4 tabs for 1 days, then 3 tabs for 1 days, 2 tabs for 1 days, then 1 tab by mouth daily for 1 days 08/05/24  Yes Kiasia Chou R, NP  aspirin  EC 81 MG tablet Take 1 tablet (81 mg total) by mouth daily. Swallow whole. Patient not  taking: Reported on 10/21/2023 08/12/23   Antonette Angeline ORN, NP  azelastine  (OPTIVAR ) 0.05 % ophthalmic solution Place 1 drop into both eyes daily as needed (for itchy/watery eyes). As needed 10/17/19   Luke Orlan HERO, DO  ezetimibe  (ZETIA ) 10 MG tablet TAKE 1 TABLET BY MOUTH EVERY DAY 03/18/24   Antonette Angeline ORN, NP  famotidine  (PEPCID ) 10 MG tablet Take 1 tablet (10 mg total) by mouth 2 (two) times daily. 01/16/22   Antonette Angeline ORN, NP  rosuvastatin  (CRESTOR ) 10 MG tablet Take 1 tablet (10 mg total) by mouth daily. 02/05/24   Antonette Angeline ORN, NP    Family History Family History  Problem Relation Age of Onset   Hyperlipidemia Mother     Social History Social History   Tobacco Use   Smoking status: Every Day    Current packs/day: 0.50    Average packs/day: 0.5 packs/day for 38.0 years (19.0 ttl pk-yrs)    Types: Cigarettes   Smokeless tobacco: Never   Tobacco comments:            Vaping Use   Vaping status: Never Used  Substance Use Topics   Alcohol use: Yes    Alcohol/week: 3.0 standard drinks of alcohol    Types: 3 Cans of beer per week    Comment: 1-3 beers during the week, more on the weekends   Drug  use: No     Allergies   Patient has no known allergies.   Review of Systems Review of Systems   Physical Exam Triage Vital Signs ED Triage Vitals  Encounter Vitals Group     BP 08/05/24 0904 (S) (!) 156/97     Girls Systolic BP Percentile --      Girls Diastolic BP Percentile --      Boys Systolic BP Percentile --      Boys Diastolic BP Percentile --      Pulse Rate 08/05/24 0904 81     Resp 08/05/24 0904 16     Temp 08/05/24 0904 97.6 F (36.4 C)     Temp Source 08/05/24 0904 Temporal     SpO2 08/05/24 0904 98 %     Weight --      Height --      Head Circumference --      Peak Flow --      Pain Score 08/05/24 0905 9     Pain Loc --      Pain Education --      Exclude from Growth Chart --    No data found.  Updated Vital Signs BP (S) (!) 156/97 (BP Location:  Right Arm) Comment: Stopped his BP meds 2 months ago due to intolerance  Pulse 81   Temp 97.6 F (36.4 C) (Temporal)   Resp 16   SpO2 98%   Visual Acuity Right Eye Distance:   Left Eye Distance:   Bilateral Distance:    Right Eye Near:   Left Eye Near:    Bilateral Near:     Physical Exam Constitutional:      Appearance: Normal appearance.  Eyes:     Extraocular Movements: Extraocular movements intact.  Pulmonary:     Effort: Pulmonary effort is normal.  Musculoskeletal:     Comments: Tenderness present to the sacrum, no ecchymosis welling or deformity  Neurological:     Mental Status: He is alert and oriented to person, place, and time.      UC Treatments / Results  Labs (all labs ordered are listed, but only abnormal results are displayed) Labs Reviewed - No data to display  EKG   Radiology No results found.  Procedures Procedures (including critical care time)  Medications Ordered in UC Medications  ketorolac (TORADOL) 30 MG/ML injection 30 mg (has no administration in time range)    Initial Impression / Assessment and Plan / UC Course  I have reviewed the triage vital signs and the nursing notes.  Pertinent labs & imaging results that were available during my care of the patient were reviewed by me and considered in my medical decision making (see chart for details).  Sacral pain  X-rays pending, will notify via telephone, Toradol IM given and prescribed prednisone  and Flexeril for home use recommended supportive care through R[facility], heat massage stretching with activity as tolerated, if fracture noted to follow-up with orthopedics for reevaluation and further management Final Clinical Impressions(s) / UC Diagnoses   Final diagnoses:  Sacral pain     Discharge Instructions      X-ray is pending and you will be notified of results via telephone if there is a break in the bones and you will need to follow-up with orthopedic specialist information  also for page, you will call to schedule an appointment  Today you were given injection of Toradol to help reduce inflammation and pain and ideally will see some improvement within an hour  Start tomorrow take prednisone  every morning with food as directed to continue the process, may take Tylenol or use any topical creams  You may use muscle relaxant at bedtime to allow for rest  You may use heating pad in 15 minute intervals as needed for additional comfort, within the first 2-3 days you may find comfort in using ice in 10-15 minutes over affected area  When sitting and lying down place pillow underneath the buttocks  If pain persist after recommended treatment or reoccurs if may be beneficial to follow up with orthopedic specialist for evaluation, this doctor specializes in the bones and can manage your symptoms long-term with options such as but not limited to imaging, medications or physical therapy      ED Prescriptions     Medication Sig Dispense Auth. Provider   predniSONE  (STERAPRED UNI-PAK 21 TAB) 10 MG (21) TBPK tablet Take by mouth daily. Take 6 tabs by mouth daily  for 1 days, then 5 tabs for 1 days, then 4 tabs for 1 days, then 3 tabs for 1 days, 2 tabs for 1 days, then 1 tab by mouth daily for 1 days 21 tablet Tammatha Cobb R, NP   cyclobenzaprine (FLEXERIL) 10 MG tablet Take 1 tablet (10 mg total) by mouth at bedtime. 10 tablet Sedric Guia R, NP      PDMP not reviewed this encounter.   Teresa Shelba SAUN[place], [place] 08/05/24 [phone]

## 2024-08-05 NOTE — Telephone Encounter (Signed)
 Reported x-ray results via telephone, 2 patient identifiers used, no change in treatment plan

## 2024-08-05 NOTE — ED Triage Notes (Signed)
 Patient presents to [facility] for lower back pain and coccyx pain x 3 days. He fell and landed on his tailbone. States he has applied muscle ache cream to area. He has not taking any OTC pain meds. Reports pain worse when sitting.

## 2024-08-09 ENCOUNTER — Ambulatory Visit (INDEPENDENT_AMBULATORY_CARE_PROVIDER_SITE_OTHER): Admitting: Internal Medicine

## 2024-08-09 ENCOUNTER — Encounter: Payer: Self-pay | Admitting: Internal Medicine

## 2024-08-09 VITALS — BP 140/80 | Ht 64.0 in | Wt 163.8 lb

## 2024-08-09 DIAGNOSIS — I10 Essential (primary) hypertension: Secondary | ICD-10-CM

## 2024-08-09 DIAGNOSIS — Z23 Encounter for immunization: Secondary | ICD-10-CM | POA: Diagnosis not present

## 2024-08-09 DIAGNOSIS — E663 Overweight: Secondary | ICD-10-CM

## 2024-08-09 DIAGNOSIS — F1090 Alcohol use, unspecified, uncomplicated: Secondary | ICD-10-CM | POA: Diagnosis not present

## 2024-08-09 DIAGNOSIS — M533 Sacrococcygeal disorders, not elsewhere classified: Secondary | ICD-10-CM | POA: Diagnosis not present

## 2024-08-09 DIAGNOSIS — I700 Atherosclerosis of aorta: Secondary | ICD-10-CM | POA: Diagnosis not present

## 2024-08-09 DIAGNOSIS — K219 Gastro-esophageal reflux disease without esophagitis: Secondary | ICD-10-CM

## 2024-08-09 DIAGNOSIS — Z6828 Body mass index (BMI) 28.0-28.9, adult: Secondary | ICD-10-CM | POA: Diagnosis not present

## 2024-08-09 DIAGNOSIS — E782 Mixed hyperlipidemia: Secondary | ICD-10-CM

## 2024-08-09 DIAGNOSIS — R7303 Prediabetes: Secondary | ICD-10-CM | POA: Insufficient documentation

## 2024-08-09 MED ORDER — OLMESARTAN MEDOXOMIL 20 MG PO TABS: 20.0000 mg | ORAL_TABLET | Freq: Every day | ORAL | 1 refills | Status: AC

## 2024-08-09 NOTE — Assessment & Plan Note (Signed)
 Uncontrolled off olmesartan  20 mg daily Discussed medication compliance, discussed risk of heart attack stroke and death if he does not take this medication Reinforced DASH diet and exercise for weight loss C-Met today

## 2024-08-09 NOTE — Assessment & Plan Note (Signed)
 C-Met and lipid profile today Encouraged him to consume a low-fat diet Continue rosuvastatin  10 mg and ezetimibe  10 mg daily

## 2024-08-09 NOTE — Assessment & Plan Note (Signed)
 Try to identify and avoid foods that trigger reflux Encourage weight loss as this can help reduce reflux symptoms Continue famotidine  10 mg twice daily

## 2024-08-09 NOTE — Assessment & Plan Note (Signed)
 A1c today Encourage low-carb diet and exercise for weight loss

## 2024-08-09 NOTE — Progress Notes (Signed)
 Subjective:    Patient ID: Kevin Graham, male    DOB: [birth year], 53 y.o.   MRN: [id number]  HPI  Patient presents to clinic today for 32-month follow-up of chronic conditions.  HLD with aortic atherosclerosis: His last LDL was 116, triglycerides 309 558, 01/2024.  He denies myalgias on rosuvastatin  and ezetimibe .  He is not taking aspirin  because it hurts his stomach.  He does not consume a low-fat diet.  GERD: He is not sure what triggers this.  He denies breakthrough on famotidine .  Upper GI from 01/2022 reviewed.  Prediabetes: His last A1c was 5.8%, 01/2024.  He is not taking any oral diabetic medication at this time.  He does not check his sugars.  HTN: His BP today is 144/84. He is not taking olmesartan  as prescribed. ECG from 03/2017 reviewed.   He also reports tailbone pain.  He reports this started 1 week ago after a fall.  He did go to urgent care for the same.  He reports x-ray of the sacrum was negative for fracture.  He was given prescription for prednisone  and cyclobenzaprine.  He reports the pain is slowly improving but does find it hard to sit directly on his buttocks.  Review of Systems     Past Medical History:  Diagnosis Date   Environmental allergies    History of stomach ulcers    Seasonal allergies     Current Outpatient Medications  Medication Sig Dispense Refill   aspirin  EC 81 MG tablet Take 1 tablet (81 mg total) by mouth daily. Swallow whole. (Patient not taking: Reported on 10/21/2023)     azelastine  (OPTIVAR ) 0.05 % ophthalmic solution Place 1 drop into both eyes daily as needed (for itchy/watery eyes). As needed 6 mL 5   cyclobenzaprine (FLEXERIL) 10 MG tablet Take 1 tablet (10 mg total) by mouth at bedtime. 10 tablet 0   ezetimibe  (ZETIA ) 10 MG tablet TAKE 1 TABLET BY MOUTH EVERY DAY 30 tablet 5   famotidine  (PEPCID ) 10 MG tablet Take 1 tablet (10 mg total) by mouth 2 (two) times daily. 180 tablet 1   predniSONE  (STERAPRED UNI-PAK 21 TAB) 10 MG (21) TBPK  tablet Take by mouth daily. Take 6 tabs by mouth daily  for 1 days, then 5 tabs for 1 days, then 4 tabs for 1 days, then 3 tabs for 1 days, 2 tabs for 1 days, then 1 tab by mouth daily for 1 days 21 tablet 0   rosuvastatin  (CRESTOR ) 10 MG tablet Take 1 tablet (10 mg total) by mouth daily. 90 tablet 1   No current facility-administered medications for this visit.    No Known Allergies  Family History  Problem Relation Age of Onset   Hyperlipidemia Mother     Social History   Socioeconomic History   Marital status: Single    Spouse name: Not on file   Number of children: Not on file   Years of education: Not on file   Highest education level: Not on file  Occupational History   Not on file  Tobacco Use   Smoking status: Every Day    Current packs/day: 0.50    Average packs/day: 0.5 packs/day for 38.0 years (19.0 ttl pk-yrs)    Types: Cigarettes   Smokeless tobacco: Never   Tobacco comments:            Vaping Use   Vaping status: Never Used  Substance and Sexual Activity   Alcohol use: Yes    Alcohol/week:  3.0 standard drinks of alcohol    Types: 3 Cans of beer per week    Comment: 1-3 beers during the week, more on the weekends   Drug use: No   Sexual activity: Yes    Partners: Female  Other Topics Concern   Not on file  Social History Narrative   Lives with girlfriend and her child in a one bedroom home.  Has 5 children.  Works at [employer].  Education: high school.   Social Drivers of Corporate Investment Banker Strain: Not on file  Food Insecurity: Not on file  Transportation Needs: Not on file  Physical Activity: Not on file  Stress: Not on file  Social Connections: Not on file  Intimate Partner Violence: Not on file     Constitutional: Denies fever, malaise, fatigue, headache or abrupt weight changes.  HEENT: Denies eye pain, eye redness, ear pain, ringing in the ears, wax buildup, runny nose, nasal congestion, bloody nose, or sore throat. Respiratory:  Denies difficulty breathing, shortness of breath, cough or sputum production.   Cardiovascular: Denies chest pain, chest tightness, palpitations or swelling in the hands or feet.  Gastrointestinal: Denies abdominal pain, bloating, constipation, diarrhea or blood in the stool.  GU: Denies urgency, frequency, pain with urination, burning sensation, blood in urine, odor or discharge. Musculoskeletal: Patient reports tailbone pain.  Denies decrease in range of motion, difficulty with gait, muscle pain or joint swelling.  Skin: Denies redness, rashes, lesions or ulcercations.  Neurological: Denies dizziness, difficulty with memory, difficulty with speech or problems with balance and coordination.  Psych: Denies anxiety, depression, SI/HI.  No other specific complaints in a complete review of systems (except as listed in HPI above).  Objective:   Physical Exam BP (!) 144/84 (BP Location: Left Arm, Patient Position: Sitting, Cuff Size: Normal)   Ht 5' 4 (1.626 m)   Wt 163 lb 12.8 oz (74.3 kg)   BMI 28.12 kg/m   Wt Readings from Last 3 Encounters:  02/05/24 159 lb 9.6 oz (72.4 kg)  12/21/23 158 lb 3.2 oz (71.8 kg)  10/21/23 161 lb 6.4 oz (73.2 kg)    General: Appears his stated age, overweight, in NAD. Skin: Warm, dry and intact.  HEENT: Head: normal shape and size; Eyes: sclera white, no icterus, conjunctiva pink, PERRLA and EOMs intact;  Cardiovascular: Normal rate and rhythm. S1,S2 noted.  No murmur, rubs or gallops noted. No JVD or BLE edema. No carotid bruits noted. Pulmonary/Chest: Normal effort and positive vesicular breath sounds. No respiratory distress. No wheezes, rales or ronchi noted.  Abdomen: Soft and nontender. Normal bowel sounds. No distention or masses noted. Liver, spleen and kidneys non palpable. Musculoskeletal: Normal flexion, extension and rotation of the spine.  Pain with palpation over the sacrum.  No difficulty with gait.  Neurological: Alert and oriented.   Coordination normal.   BMET    Component Value Date/Time   NA 138 02/05/2024 0918   K 4.4 02/05/2024 0918   CL 105 02/05/2024 0918   CO2 29 02/05/2024 0918   GLUCOSE 99 02/05/2024 0918   BUN 15 02/05/2024 0918   CREATININE 0.80 02/05/2024 0918   CALCIUM  10.4 (H) 02/05/2024 0918   GFRNONAA >60 04/06/2017 2018   GFRAA >60 04/06/2017 2018    Lipid Panel     Component Value Date/Time   CHOL 200 (H) 02/05/2024 0918   TRIG 309 (H) 02/05/2024 0918   HDL 43 02/05/2024 0918   CHOLHDL 4.7 02/05/2024 9081  VLDL 41.8 (H) 02/16/2020 0825   LDLCALC 116 (H) 02/05/2024 0918    CBC    Component Value Date/Time   WBC 4.6 02/05/2024 0918   RBC 4.96 02/05/2024 0918   HGB 16.0 02/05/2024 0918   HCT 47.8 02/05/2024 0918   PLT 160 02/05/2024 0918   MCV 96.4 02/05/2024 0918   MCH 32.3 02/05/2024 0918   MCHC 33.5 02/05/2024 0918   RDW 12.4 02/05/2024 0918    Hgb A1C Lab Results  Component Value Date   HGBA1C 5.8 (H) 02/05/2024           Assessment & Plan:   Sacral pain status post fall:  Urgent care notes and imaging reviewed He should avoid NSAIDs due to stomach pain caused by NSAIDs and aspirin  Okay to take Tylenol 1000 mg every 8 hours as needed Recommend frequent positioning and avoidance of pressure directly on the sacrum  RTC in 2 weeks, follow-up HTN, 6 months for your annual exam Angeline Laura, NP

## 2024-08-09 NOTE — Patient Instructions (Signed)

## 2024-08-09 NOTE — Assessment & Plan Note (Signed)
 C-Met and lipid profile today Encouraged him to consume a low-fat diet Continue rosuvastatin  10 mg and ezetimibe  10 mg daily He is not taking aspirin  because he reports it hurts his stomach even though he takes famotidine  10 mg twice daily

## 2024-08-09 NOTE — Assessment & Plan Note (Signed)
 Encouraged diet and exercise for weight loss ?

## 2024-08-09 NOTE — Assessment & Plan Note (Signed)
 He does not feel like he has an alcohol problem at this time We will continue to monitor

## 2024-08-10 ENCOUNTER — Ambulatory Visit: Payer: Self-pay | Admitting: Internal Medicine

## 2024-08-10 LAB — COMPREHENSIVE METABOLIC PANEL WITH GFR
AG Ratio: 2 (calc) (ref 1.0–2.5)
ALT: 26 U/L (ref 9–46)
AST: 15 U/L (ref 10–35)
Albumin: 4.4 g/dL (ref 3.6–5.1)
Alkaline phosphatase (APISO): 45 U/L (ref 35–144)
BUN: 20 mg/dL (ref 7–25)
CO2: 24 mmol/L (ref 20–32)
Calcium: 9.9 mg/dL (ref 8.6–10.3)
Chloride: 106 mmol/L (ref 98–110)
Creat: 0.81 mg/dL (ref 0.70–1.30)
Globulin: 2.2 g/dL (ref 1.9–3.7)
Glucose, Bld: 90 mg/dL (ref 65–99)
Potassium: 3.6 mmol/L (ref 3.5–5.3)
Sodium: 137 mmol/L (ref 135–146)
Total Bilirubin: 0.4 mg/dL (ref 0.2–1.2)
Total Protein: 6.6 g/dL (ref 6.1–8.1)
eGFR: 105 mL/min/1.73m2 (ref >=60)

## 2024-08-10 LAB — CBC
HCT: 44.6 % (ref 38.5–50.0)
Hemoglobin: 15 g/dL (ref 13.2–17.1)
MCH: 32.4 pg (ref 27.0–33.0)
MCHC: 33.6 g/dL (ref 32.0–36.0)
MCV: 96.3 fL (ref 80.0–100.0)
MPV: 9.8 fL (ref 7.5–12.5)
Platelets: 179 Thousand/uL (ref 140–400)
RBC: 4.63 Million/uL (ref 4.20–5.80)
RDW: 12.9 % (ref 11.0–15.0)
WBC: 5 Thousand/uL (ref 3.8–10.8)

## 2024-08-10 LAB — LIPID PANEL
Cholesterol: 256 mg/dL — ABNORMAL HIGH (ref <200)
HDL: 50 mg/dL (ref >=40)
LDL Cholesterol (Calc): 145 mg/dL — ABNORMAL HIGH
Non-HDL Cholesterol (Calc): 206 mg/dL — ABNORMAL HIGH (ref <130)
Total CHOL/HDL Ratio: 5.1 (calc) — ABNORMAL HIGH (ref <5.0)
Triglycerides: 396 mg/dL — ABNORMAL HIGH (ref <150)

## 2024-08-10 LAB — HEMOGLOBIN A1C
Hgb A1c MFr Bld: 6.1 % — ABNORMAL HIGH (ref <5.7)
Mean Plasma Glucose: 128 mg/dL
eAG (mmol/L): 7.1 mmol/L

## 2024-08-10 MED ORDER — ROSUVASTATIN CALCIUM 20 MG PO TABS: 20.0000 mg | ORAL_TABLET | Freq: Every day | ORAL | 1 refills | Status: AC

## 2024-09-01 ENCOUNTER — Encounter: Payer: Self-pay | Admitting: Internal Medicine

## 2024-09-01 ENCOUNTER — Ambulatory Visit (INDEPENDENT_AMBULATORY_CARE_PROVIDER_SITE_OTHER): Admitting: Internal Medicine

## 2024-09-01 VITALS — BP 130/88 | Ht 64.0 in | Wt 162.0 lb

## 2024-09-01 DIAGNOSIS — I10 Essential (primary) hypertension: Secondary | ICD-10-CM

## 2024-09-01 NOTE — Patient Instructions (Signed)
 Hypertension, Adult Hypertension is another name for high blood pressure. High blood pressure forces your heart to work harder to pump blood. This can cause problems over time. There are two numbers in a blood pressure reading. There is a top number (systolic) over a bottom number (diastolic). It is best to have a blood pressure that is below 120/80. What are the causes? The cause of this condition is not known. Some other conditions can lead to high blood pressure. What increases the risk? Some lifestyle factors can make you more likely to develop high blood pressure: Smoking. Not getting enough exercise or physical activity. Being overweight. Having too much fat, sugar, calories, or salt (sodium) in your diet. Drinking too much alcohol. Other risk factors include: Having any of these conditions: Heart disease. Diabetes. High cholesterol. Kidney disease. Obstructive sleep apnea. Having a family history of high blood pressure and high cholesterol. Age. The risk increases with age. Stress. What are the signs or symptoms? High blood pressure may not cause symptoms. Very high blood pressure (hypertensive crisis) may cause: Headache. Fast or uneven heartbeats (palpitations). Shortness of breath. Nosebleed. Vomiting or feeling like you may vomit (nauseous). Changes in how you see. Very bad chest pain. Feeling dizzy. Seizures. How is this treated? This condition is treated by making healthy lifestyle changes, such as: Eating healthy foods. Exercising more. Drinking less alcohol. Your doctor may prescribe medicine if lifestyle changes do not help enough and if: Your top number is above 130. Your bottom number is above 80. Your personal target blood pressure may vary. Follow these instructions at home: Eating and drinking  If told, follow the DASH eating plan. To follow this plan: Fill one half of your plate at each meal with fruits and vegetables. Fill one fourth of your plate  at each meal with whole grains. Whole grains include whole-wheat pasta, brown rice, and whole-grain bread. Eat or drink low-fat dairy products, such as skim milk or low-fat yogurt. Fill one fourth of your plate at each meal with low-fat (lean) proteins. Low-fat proteins include fish, chicken without skin, eggs, beans, and tofu. Avoid fatty meat, cured and processed meat, or chicken with skin. Avoid pre-made or processed food. Limit the amount of salt in your diet to less than 1,500 mg each day. Do not drink alcohol if: Your doctor tells you not to drink. You are pregnant, may be pregnant, or are planning to become pregnant. If you drink alcohol: Limit how much you have to: 0-1 drink a day for women. 0-2 drinks a day for men. Know how much alcohol is in your drink. In the [place] one drink equals one 12 oz bottle of beer (355 mL), one 5 oz glass of wine (148 mL), or one 1 oz glass of hard liquor (44 mL). Lifestyle  Work with your doctor to stay at a healthy weight or to lose weight. Ask your doctor what the best weight is for you. Get at least 30 minutes of exercise that causes your heart to beat faster (aerobic exercise) most days of the week. This may include walking, swimming, or biking. Get at least 30 minutes of exercise that strengthens your muscles (resistance exercise) at least 3 days a week. This may include lifting weights or doing Pilates. Do not smoke or use any products that contain nicotine or tobacco. If you need help quitting, ask your doctor. Check your blood pressure at home as told by your doctor. Keep all follow-up visits. Medicines Take over-the-counter and prescription medicines  only as told by your doctor. Follow directions carefully. Do not skip doses of blood pressure medicine. The medicine does not work as well if you skip doses. Skipping doses also puts you at risk for problems. Ask your doctor about side effects or reactions to medicines that you should watch  for. Contact a doctor if: You think you are having a reaction to the medicine you are taking. You have headaches that keep coming back. You feel dizzy. You have swelling in your ankles. You have trouble with your vision. Get help right away if: You get a very bad headache. You start to feel mixed up (confused). You feel weak or numb. You feel faint. You have very bad pain in your: Chest. Belly (abdomen). You vomit more than once. You have trouble breathing. These symptoms may be an emergency. Get help right away. Call 911. Do not wait to see if the symptoms will go away. Do not drive yourself to the hospital. Summary Hypertension is another name for high blood pressure. High blood pressure forces your heart to work harder to pump blood. For most people, a normal blood pressure is less than 120/80. Making healthy choices can help lower blood pressure. If your blood pressure does not get lower with healthy choices, you may need to take medicine. This information is not intended to replace advice given to you by your health care provider. Make sure you discuss any questions you have with your health care provider. Document Revised: 07/04/2021 Document Reviewed: 07/04/2021 Elsevier Patient Education  2024 [employer].

## 2024-09-01 NOTE — Progress Notes (Signed)
 Subjective:    Patient ID: [name], male    DOB: 1971-01-14, 52 y.o.   MRN: [id number]  HPI Discussed the use of AI scribe software for clinical note transcription with the patient, who gave verbal consent to proceed.   Kevin Graham is a 53 year old male with hypertension who presents for a follow-up on blood pressure management.  He is following up on his blood pressure after restarting olmesartan  20 mg.  His BP today is 130/88, previously 156/97.  He reports no symptoms such as lightheadedness, dizziness, or chest pain.     Review of Systems     Past Medical History:  Diagnosis Date   Environmental allergies    History of stomach ulcers    Seasonal allergies     Current Outpatient Medications  Medication Sig Dispense Refill   aspirin  EC 81 MG tablet Take 1 tablet (81 mg total) by mouth daily. Swallow whole. (Patient not taking: Reported on 08/09/2024)     azelastine  (OPTIVAR ) 0.05 % ophthalmic solution Place 1 drop into both eyes daily as needed (for itchy/watery eyes). As needed 6 mL 5   cyclobenzaprine  (FLEXERIL ) 10 MG tablet Take 1 tablet (10 mg total) by mouth at bedtime. 10 tablet 0   ezetimibe  (ZETIA ) 10 MG tablet TAKE 1 TABLET BY MOUTH EVERY DAY 30 tablet 5   famotidine  (PEPCID ) 10 MG tablet Take 1 tablet (10 mg total) by mouth 2 (two) times daily. 180 tablet 1   olmesartan  (BENICAR ) 20 MG tablet Take 1 tablet (20 mg total) by mouth daily. 90 tablet 1   rosuvastatin  (CRESTOR ) 20 MG tablet Take 1 tablet (20 mg total) by mouth daily. 90 tablet 1   No current facility-administered medications for this visit.    No Known Allergies  Family History  Problem Relation Age of Onset   Hyperlipidemia Mother     Social History   Socioeconomic History   Marital status: Single    Spouse name: Not on file   Number of children: Not on file   Years of education: Not on file   Highest education level: 12th grade  Occupational History   Not on file  Tobacco Use    Smoking status: Every Day    Current packs/day: 0.50    Average packs/day: 0.5 packs/day for 38.0 years (19.0 ttl pk-yrs)    Types: Cigarettes   Smokeless tobacco: Never   Tobacco comments:            Vaping Use   Vaping status: Never Used  Substance and Sexual Activity   Alcohol use: Yes    Alcohol/week: 3.0 standard drinks of alcohol    Types: 3 Cans of beer per week    Comment: 1-3 beers during the week, more on the weekends   Drug use: No   Sexual activity: Yes    Partners: Female  Other Topics Concern   Not on file  Social History Narrative   Lives with girlfriend and her child in a one bedroom home.  Has 5 children.  Works at [employer].  Education: high school.   Social Drivers of Corporate Investment Banker Strain: Low Risk  (08/29/2024)   Overall Financial Resource Strain (CARDIA)    Difficulty of Paying Living Expenses: Not hard at all  Food Insecurity: Patient Declined (08/29/2024)   Hunger Vital Sign    Worried About Running Out of Food in the Last Year: Patient declined    Ran Out of Food in the  Last Year: Patient declined  Transportation Needs: Patient Declined (08/29/2024)   PRAPARE - Administrator, Civil Service (Medical): Patient declined    Lack of Transportation (Non-Medical): Patient declined  Physical Activity: Not on file  Stress: No Stress Concern Present (08/29/2024)   [employer] of Occupational Health - Occupational Stress Questionnaire    Feeling of Stress: Not at all  Social Connections: Moderately Isolated (08/29/2024)   Social Connection and Isolation Panel    Frequency of Communication with Friends and Family: Patient declined    Frequency of Social Gatherings with Friends and Family: More than three times a week    Attends Religious Services: Patient declined    Database Administrator or Organizations: No    Attends Engineer, Structural: Not on file    Marital Status: Living with partner  Intimate Partner Violence:  Not on file     Constitutional: Denies fever, malaise, fatigue, headache or abrupt weight changes.  HEENT: Denies eye pain, eye redness, ear pain, ringing in the ears, wax buildup, runny nose, nasal congestion, bloody nose, or sore throat. Respiratory: Denies difficulty breathing, shortness of breath, cough or sputum production.   Cardiovascular: Denies chest pain, chest tightness, palpitations or swelling in the hands or feet.  Neurological: Denies dizziness, difficulty with memory, difficulty with speech or problems with balance and coordination.    No other specific complaints in a complete review of systems (except as listed in HPI above).  Objective:   Physical Exam BP 130/88 (BP Location: Left Arm, Patient Position: Sitting, Cuff Size: Normal)   Ht 5' 4 (1.626 m)   Wt 162 lb (73.5 kg)   BMI 27.81 kg/m    Wt Readings from Last 3 Encounters:  08/09/24 163 lb 12.8 oz (74.3 kg)  02/05/24 159 lb 9.6 oz (72.4 kg)  12/21/23 158 lb 3.2 oz (71.8 kg)    General: Appears his stated age, overweight, in NAD.  HEENT: Head: normal shape and size; Eyes: sclera white, no icterus, conjunctiva pink, PERRLA and EOMs intact;  Cardiovascular: Normal rate and rhythm. S1,S2 noted.  No murmur, rubs or gallops noted. No JVD or BLE edema.  Pulmonary/Chest: Normal effort and positive vesicular breath sounds. No respiratory distress. No wheezes, rales or ronchi noted.  Musculoskeletal: No difficulty with gait.  Neurological: Alert and oriented.  Coordination normal.   BMET    Component Value Date/Time   NA 137 08/09/2024 0920   K 3.6 08/09/2024 0920   CL 106 08/09/2024 0920   CO2 24 08/09/2024 0920   GLUCOSE 90 08/09/2024 0920   BUN 20 08/09/2024 0920   CREATININE 0.81 08/09/2024 0920   CALCIUM  9.9 08/09/2024 0920   GFRNONAA >60 04/06/2017 2018   GFRAA >60 04/06/2017 2018    Lipid Panel     Component Value Date/Time   CHOL 256 (H) 08/09/2024 0920   TRIG 396 (H) 08/09/2024 0920   HDL  50 08/09/2024 0920   CHOLHDL 5.1 (H) 08/09/2024 0920   VLDL 41.8 (H) 02/16/2020 0825   LDLCALC 145 (H) 08/09/2024 0920    CBC    Component Value Date/Time   WBC 5.0 08/09/2024 0920   RBC 4.63 08/09/2024 0920   HGB 15.0 08/09/2024 0920   HCT 44.6 08/09/2024 0920   PLT 179 08/09/2024 0920   MCV 96.3 08/09/2024 0920   MCH 32.4 08/09/2024 0920   MCHC 33.6 08/09/2024 0920   RDW 12.9 08/09/2024 0920    Hgb A1C Lab Results  Component  Value Date   HGBA1C 6.1 (H) 08/09/2024           Assessment & Plan:   Assessment and Plan    Hypertension Well-controlled with olmesartan  20 mg. Blood pressure 130/88 mmHg. No symptoms reported. Medication adherence confirmed. - Continue olmesartan  20 mg daily. - Reinforced DASH diet and exercise for weight       RTC in 5 months for your annual exam Angeline Laura, NP

## 2025-02-08 ENCOUNTER — Encounter: Admitting: Internal Medicine
